# Patient Record
Sex: Female | Born: 1995 | Race: White | Hispanic: No | Smoking: Never smoker
Health system: Southern US, Community
[De-identification: ages and names within clinical notes are randomized; demographics above are authoritative.]

## PROBLEM LIST (undated history)

## (undated) DIAGNOSIS — F3130 Bipolar disorder, current episode depressed, mild or moderate severity, unspecified: Secondary | ICD-10-CM

## (undated) HISTORY — PX: NO PAST SURGERIES: SHX2092

---
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
## 2014-09-06 ENCOUNTER — Other Ambulatory Visit: Payer: Self-pay | Admitting: Obstetrics & Gynecology

## 2014-09-06 DIAGNOSIS — O3680X1 Pregnancy with inconclusive fetal viability, fetus 1: Secondary | ICD-10-CM

## 2014-09-07 ENCOUNTER — Ambulatory Visit (INDEPENDENT_AMBULATORY_CARE_PROVIDER_SITE_OTHER): Payer: Managed Care, Other (non HMO)

## 2014-09-07 DIAGNOSIS — O3680X1 Pregnancy with inconclusive fetal viability, fetus 1: Secondary | ICD-10-CM | POA: Diagnosis not present

## 2014-09-07 MED ORDER — DOXYLAMINE-PYRIDOXINE 10-10 MG PO TBEC: 10.0000 mg | DELAYED_RELEASE_TABLET | ORAL | Status: DC

## 2014-09-07 NOTE — Progress Notes (Signed)
US 8+5wks single IUP w/ys,pos fht 170,normal ov's bilat,crl 21.718mm

## 2014-09-10 ENCOUNTER — Other Ambulatory Visit: Payer: Self-pay

## 2014-09-18 ENCOUNTER — Encounter: Payer: Self-pay | Admitting: Women's Health

## 2014-09-18 ENCOUNTER — Ambulatory Visit (INDEPENDENT_AMBULATORY_CARE_PROVIDER_SITE_OTHER): Payer: Managed Care, Other (non HMO) | Admitting: Women's Health

## 2014-09-18 VITALS — BP 104/60 | HR 68 | Ht 60.0 in | Wt 143.0 lb

## 2014-09-18 DIAGNOSIS — Z331 Pregnant state, incidental: Secondary | ICD-10-CM

## 2014-09-18 DIAGNOSIS — Z1389 Encounter for screening for other disorder: Secondary | ICD-10-CM

## 2014-09-18 DIAGNOSIS — Z0283 Encounter for blood-alcohol and blood-drug test: Secondary | ICD-10-CM

## 2014-09-18 DIAGNOSIS — Z3401 Encounter for supervision of normal first pregnancy, first trimester: Secondary | ICD-10-CM | POA: Diagnosis not present

## 2014-09-18 DIAGNOSIS — Z3400 Encounter for supervision of normal first pregnancy, unspecified trimester: Secondary | ICD-10-CM | POA: Insufficient documentation

## 2014-09-18 DIAGNOSIS — Z369 Encounter for antenatal screening, unspecified: Secondary | ICD-10-CM

## 2014-09-18 DIAGNOSIS — Z3682 Encounter for antenatal screening for nuchal translucency: Secondary | ICD-10-CM

## 2014-09-18 LAB — POCT URINALYSIS DIPSTICK
Glucose, UA: NEGATIVE
LEUKOCYTES UA: NEGATIVE
Nitrite, UA: NEGATIVE
Protein, UA: NEGATIVE
RBC UA: NEGATIVE

## 2014-09-18 NOTE — Progress Notes (Signed)
  Subjective:  Tammy Price is a 19 y.o. G1P0 Caucasian female at [redacted]w[redacted]d by LMP c/w 8wk u/s, being seen today for her first obstetrical visit.  Her obstetrical history is significant for primigravida.  Pregnancy history fully reviewed.  Patient reports some nausea- on diclegis, only taking 2 qhs right now- can increase to 4 total daily (1 am, 1 afternoon, 2 qhs). Denies vb, cramping, uti s/s, abnormal/malodorous vag d/c, or vulvovaginal itching/irritation.  BP 104/60 mmHg  Pulse 68  Wt 143 lb (64.864 kg)  LMP 07/08/2014 (Exact Date)  HISTORY: OB History  Gravida Para Term Preterm AB SAB TAB Ectopic Multiple Living  1             # Outcome Date GA Lbr Len/2nd Weight Sex Delivery Anes PTL Lv  1 Current              Past Medical History  Diagnosis Date  . Bipolar affect, depressed    Past Surgical History  Procedure Laterality Date  . No past surgeries     Family History  Problem Relation Age of Onset  . Depression Mother   . Cancer Paternal Grandfather     Exam   System:     General: Well developed & nourished, no acute distress   Skin: Warm & dry, normal coloration and turgor, no rashes   Neurologic: Alert & oriented, normal mood   Cardiovascular: Regular rate & rhythm   Respiratory: Effort & rate normal, LCTAB, acyanotic   Abdomen: Soft, non tender   Extremities: normal strength, tone  Thin prep pap smear n/a <21yo  FHR: 157 via doppler   Assessment:   Pregnancy: G1P0 Patient Active Problem List   Diagnosis Date Noted  . Supervision of normal first pregnancy 09/18/2014    Priority: High    [redacted]w[redacted]d G1P0 New OB visit N/V of pregnancy  Plan:  Initial labs drawn Continue prenatal vitamins Problem list reviewed and updated Reviewed n/v relief measures and warning s/s to report Reviewed recommended weight gain based on pre-gravid BMI Encouraged well-balanced diet Genetic Screening discussed Integrated Screen: requested Cystic fibrosis screening discussed  requested Ultrasound discussed; fetal survey: requested Follow up in 2 weeks for 1st nt/it and visit [facility] completed  Marge Duncans CNM, [facility] 09/18/2014 2:10 PM

## 2014-09-18 NOTE — Patient Instructions (Signed)

## 2014-09-19 ENCOUNTER — Encounter: Payer: Self-pay | Admitting: Women's Health

## 2014-09-19 DIAGNOSIS — O26899 Other specified pregnancy related conditions, unspecified trimester: Secondary | ICD-10-CM

## 2014-09-19 DIAGNOSIS — Z6791 Unspecified blood type, Rh negative: Secondary | ICD-10-CM | POA: Insufficient documentation

## 2014-09-19 DIAGNOSIS — Z2839 Other underimmunization status: Secondary | ICD-10-CM | POA: Insufficient documentation

## 2014-09-19 DIAGNOSIS — Z283 Underimmunization status: Secondary | ICD-10-CM

## 2014-09-19 DIAGNOSIS — O09899 Supervision of other high risk pregnancies, unspecified trimester: Secondary | ICD-10-CM | POA: Insufficient documentation

## 2014-09-19 LAB — URINALYSIS, ROUTINE W REFLEX MICROSCOPIC
Bilirubin, UA: NEGATIVE
Glucose, UA: NEGATIVE
Leukocytes, UA: NEGATIVE
Nitrite, UA: NEGATIVE
PH UA: 7.5 (ref 5.0–7.5)
Protein, UA: NEGATIVE
RBC UA: NEGATIVE
SPEC GRAV UA: 1.019 (ref 1.005–1.030)
Urobilinogen, Ur: 0.2 mg/dL (ref 0.2–1.0)

## 2014-09-19 LAB — RPR: RPR Ser Ql: NONREACTIVE

## 2014-09-19 LAB — GC/CHLAMYDIA PROBE AMP
Chlamydia trachomatis, NAA: NEGATIVE
NEISSERIA GONORRHOEAE BY PCR: NEGATIVE

## 2014-09-19 LAB — CBC
HEMATOCRIT: 39 % (ref 34.0–46.6)
HEMOGLOBIN: 13.4 g/dL (ref 11.1–15.9)
MCH: 31.8 pg (ref 26.6–33.0)
MCHC: 34.4 g/dL (ref 31.5–35.7)
MCV: 93 fL (ref 79–97)
Platelets: 264 10*3/uL (ref 150–379)
RBC: 4.21 x10E6/uL (ref 3.77–5.28)
RDW: 13.8 % (ref 12.3–15.4)
WBC: 11.4 10*3/uL — AB (ref 3.4–10.8)

## 2014-09-19 LAB — PMP SCREEN PROFILE (10S), URINE
AMPHETAMINE SCRN UR: NEGATIVE ng/mL
BENZODIAZEPINE SCREEN, URINE: NEGATIVE ng/mL
Barbiturate Screen, Ur: NEGATIVE ng/mL
CANNABINOIDS UR QL SCN: NEGATIVE ng/mL
COCAINE(METAB.) SCREEN, URINE: NEGATIVE ng/mL
Creatinine(Crt), U: 89.3 mg/dL (ref 20.0–300.0)
Methadone Scn, Ur: NEGATIVE ng/mL
OPIATE SCRN UR: NEGATIVE ng/mL
OXYCODONE+OXYMORPHONE UR QL SCN: NEGATIVE ng/mL
PCP SCRN UR: NEGATIVE ng/mL
PH UR, DRUG SCRN: 7.4 (ref 4.5–8.9)
PROPOXYPHENE SCREEN: NEGATIVE ng/mL

## 2014-09-19 LAB — ABO/RH: RH TYPE: NEGATIVE

## 2014-09-19 LAB — URINE CULTURE

## 2014-09-19 LAB — HEPATITIS B SURFACE ANTIGEN: HEP B S AG: NEGATIVE

## 2014-09-19 LAB — HIV ANTIBODY (ROUTINE TESTING W REFLEX): HIV Screen 4th Generation wRfx: NONREACTIVE

## 2014-09-19 LAB — VARICELLA ZOSTER ANTIBODY, IGG

## 2014-09-19 LAB — RUBELLA SCREEN: Rubella Antibodies, IGG: 1.14 index (ref >0.99)

## 2014-09-19 LAB — ANTIBODY SCREEN: Antibody Screen: NEGATIVE

## 2014-09-26 LAB — CYSTIC FIBROSIS MUTATION 97: GENE DIS ANAL CARRIER INTERP BLD/T-IMP: NOT DETECTED

## 2014-10-02 ENCOUNTER — Encounter: Payer: Self-pay | Admitting: Women's Health

## 2014-10-02 ENCOUNTER — Ambulatory Visit (INDEPENDENT_AMBULATORY_CARE_PROVIDER_SITE_OTHER): Payer: Managed Care, Other (non HMO)

## 2014-10-02 ENCOUNTER — Ambulatory Visit (INDEPENDENT_AMBULATORY_CARE_PROVIDER_SITE_OTHER): Payer: Managed Care, Other (non HMO) | Admitting: Women's Health

## 2014-10-02 VITALS — BP 104/62 | HR 72 | Wt 146.0 lb

## 2014-10-02 DIAGNOSIS — Z3682 Encounter for antenatal screening for nuchal translucency: Secondary | ICD-10-CM

## 2014-10-02 DIAGNOSIS — Z36 Encounter for antenatal screening of mother: Secondary | ICD-10-CM | POA: Diagnosis not present

## 2014-10-02 DIAGNOSIS — Z3401 Encounter for supervision of normal first pregnancy, first trimester: Secondary | ICD-10-CM

## 2014-10-02 DIAGNOSIS — Z331 Pregnant state, incidental: Secondary | ICD-10-CM

## 2014-10-02 DIAGNOSIS — Z1389 Encounter for screening for other disorder: Secondary | ICD-10-CM

## 2014-10-02 LAB — POCT URINALYSIS DIPSTICK
Blood, UA: NEGATIVE
Glucose, UA: NEGATIVE
KETONES UA: NEGATIVE
Leukocytes, UA: NEGATIVE
NITRITE UA: NEGATIVE
PROTEIN UA: NEGATIVE

## 2014-10-02 NOTE — Progress Notes (Signed)
[place] 12+2wks measurement c/w dates,crl 62.48mm,nt 1.33mm,nb present,normal ov's bilat,post pl gr 0

## 2014-10-03 NOTE — Progress Notes (Signed)
Pt left w/o being seen after nt/it, rescheduled. Not seen by provider.

## 2014-10-04 LAB — MATERNAL SCREEN, INTEGRATED #1
CROWN RUMP LENGTH MAT SCREEN: 62.9 mm
GEST. AGE ON COLLECTION DATE: 12.6 wk
MATERNAL AGE AT EDD: 21.1 a
NUMBER OF FETUSES: 1
Nuchal Translucency (NT): 1.6 mm
PAPP-A Value: 1146.9 ng/mL
WEIGHT: 146 [lb_av]

## 2014-10-17 ENCOUNTER — Encounter: Payer: Managed Care, Other (non HMO) | Admitting: Advanced Practice Midwife

## 2014-11-01 ENCOUNTER — Encounter: Payer: Managed Care, Other (non HMO) | Admitting: Advanced Practice Midwife

## 2014-11-05 ENCOUNTER — Ambulatory Visit (INDEPENDENT_AMBULATORY_CARE_PROVIDER_SITE_OTHER): Payer: Managed Care, Other (non HMO) | Admitting: Women's Health

## 2014-11-05 ENCOUNTER — Encounter: Payer: Self-pay | Admitting: Women's Health

## 2014-11-05 ENCOUNTER — Telehealth: Payer: Self-pay | Admitting: Women's Health

## 2014-11-05 VITALS — BP 118/70 | HR 76 | Wt 148.4 lb

## 2014-11-05 DIAGNOSIS — Z1389 Encounter for screening for other disorder: Secondary | ICD-10-CM

## 2014-11-05 DIAGNOSIS — Z3402 Encounter for supervision of normal first pregnancy, second trimester: Secondary | ICD-10-CM

## 2014-11-05 DIAGNOSIS — Z363 Encounter for antenatal screening for malformations: Secondary | ICD-10-CM

## 2014-11-05 DIAGNOSIS — Z331 Pregnant state, incidental: Secondary | ICD-10-CM

## 2014-11-05 DIAGNOSIS — Z369 Encounter for antenatal screening, unspecified: Secondary | ICD-10-CM

## 2014-11-05 LAB — POCT URINALYSIS DIPSTICK
Glucose, UA: NEGATIVE
KETONES UA: NEGATIVE
LEUKOCYTES UA: NEGATIVE
Nitrite, UA: NEGATIVE
PROTEIN UA: NEGATIVE
RBC UA: NEGATIVE

## 2014-11-05 MED ORDER — PRENATAL PLUS 27-1 MG PO TABS: 1.0000 | ORAL_TABLET | Freq: Every day | ORAL | Status: DC

## 2014-11-05 NOTE — Patient Instructions (Signed)
Second Trimester of Pregnancy The second trimester is from week 13 through week 28, months 4 through 6. The second trimester is often a time when you feel your best. Your body has also adjusted to being pregnant, and you begin to feel better physically. Usually, morning sickness has lessened or quit completely, you may have more energy, and you may have an increase in appetite. The second trimester is also a time when the fetus is growing rapidly. At the end of the sixth month, the fetus is about 9 inches long and weighs about 1 pounds. You will likely begin to feel the baby move (quickening) between 18 and 20 weeks of the pregnancy. BODY CHANGES Your body goes through many changes during pregnancy. The changes vary from woman to woman.   Your weight will continue to increase. You will notice your lower abdomen bulging out.  You may begin to get stretch marks on your hips, abdomen, and breasts.  You may develop headaches that can be relieved by medicines approved by your health care provider.  You may urinate more often because the fetus is pressing on your bladder.  You may develop or continue to have heartburn as a result of your pregnancy.  You may develop constipation because certain hormones are causing the muscles that push waste through your intestines to slow down.  You may develop hemorrhoids or swollen, bulging veins (varicose veins).  You may have back pain because of the weight gain and pregnancy hormones relaxing your joints between the bones in your pelvis and as a result of a shift in weight and the muscles that support your balance.  Your breasts will continue to grow and be tender.  Your gums may bleed and may be sensitive to brushing and flossing.  Dark spots or blotches (chloasma, mask of pregnancy) may develop on your face. This will likely fade after the baby is born.  A dark line from your belly button to the pubic area (linea nigra) may appear. This will likely fade  after the baby is born.  You may have changes in your hair. These can include thickening of your hair, rapid growth, and changes in texture. Some women also have hair loss during or after pregnancy, or hair that feels dry or thin. Your hair will most likely return to normal after your baby is born. WHAT TO EXPECT AT YOUR PRENATAL VISITS During a routine prenatal visit:  You will be weighed to make sure you and the fetus are growing normally.  Your blood pressure will be taken.  Your abdomen will be measured to track your baby's growth.  The fetal heartbeat will be listened to.  Any test results from the previous visit will be discussed. Your health care provider may ask you:  How you are feeling.  If you are feeling the baby move.  If you have had any abnormal symptoms, such as leaking fluid, bleeding, severe headaches, or abdominal cramping.  If you have any questions. Other tests that may be performed during your second trimester include:  Blood tests that check for:  Low iron levels (anemia).  Gestational diabetes (between 24 and 28 weeks).  Rh antibodies.  Urine tests to check for infections, diabetes, or protein in the urine.  An ultrasound to confirm the proper growth and development of the baby.  An amniocentesis to check for possible genetic problems.  Fetal screens for spina bifida and Down syndrome. HOME CARE INSTRUCTIONS   Avoid all smoking, herbs, alcohol, and unprescribed   drugs. These chemicals affect the formation and growth of the baby.  Follow your health care provider's instructions regarding medicine use. There are medicines that are either safe or unsafe to take during pregnancy.  Exercise only as directed by your health care provider. Experiencing uterine cramps is a good sign to stop exercising.  Continue to eat regular, healthy meals.  Wear a good support bra for breast tenderness.  Do not use hot tubs, steam rooms, or saunas.  Wear your  seat belt at all times when driving.  Avoid raw meat, uncooked cheese, cat litter boxes, and soil used by cats. These carry germs that can cause birth defects in the baby.  Take your prenatal vitamins.  Try taking a stool softener (if your health care provider approves) if you develop constipation. Eat more high-fiber foods, such as fresh vegetables or fruit and whole grains. Drink plenty of fluids to keep your urine clear or pale yellow.  Take warm sitz baths to soothe any pain or discomfort caused by hemorrhoids. Use hemorrhoid cream if your health care provider approves.  If you develop varicose veins, wear support hose. Elevate your feet for 15 minutes, 3-4 times a day. Limit salt in your diet.  Avoid heavy lifting, wear low heel shoes, and practice good posture.  Rest with your legs elevated if you have leg cramps or low back pain.  Visit your dentist if you have not gone yet during your pregnancy. Use a soft toothbrush to brush your teeth and be gentle when you floss.  A sexual relationship may be continued unless your health care provider directs you otherwise.  Continue to go to all your prenatal visits as directed by your health care provider. SEEK MEDICAL CARE IF:   You have dizziness.  You have mild pelvic cramps, pelvic pressure, or nagging pain in the abdominal area.  You have persistent nausea, vomiting, or diarrhea.  You have a bad smelling vaginal discharge.  You have pain with urination. SEEK IMMEDIATE MEDICAL CARE IF:   You have a fever.  You are leaking fluid from your vagina.  You have spotting or bleeding from your vagina.  You have severe abdominal cramping or pain.  You have rapid weight gain or loss.  You have shortness of breath with chest pain.  You notice sudden or extreme swelling of your face, hands, ankles, feet, or legs.  You have not felt your baby move in over an hour.  You have severe headaches that do not go away with  medicine.  You have vision changes. Document Released: 02/03/2001 Document Revised: 02/14/2013 Document Reviewed: 04/12/2012 Exit[employer] Patient Information 2015 [employer]. This information is not intended to replace advice given to you by your health care provider. Make sure you discuss any questions you have with your health care provider.  

## 2014-11-05 NOTE — Progress Notes (Signed)
Low-risk OB appointment G1P0 [redacted]w[redacted]d Estimated Date of Delivery: 04/14/15 BP 118/70 mmHg  Pulse 76  Wt 148 lb 6.4 oz (67.314 kg)  LMP 07/08/2014 (Exact Date)  BP, weight, and urine reviewed.  Refer to obstetrical flow sheet for FH & FHR.  No fm yet. Denies cramping, lof, vb, or uti s/s. No complaints. Reviewed warning s/s to report. Going to [employer] this week.  Plan:  Continue routine obstetrical care  F/U in 3wks for OB appointment and anatomy u/s 2nd IT today

## 2014-11-05 NOTE — Telephone Encounter (Signed)
Pt aware that PNV had been sent to her pharmacy.

## 2014-11-07 LAB — MATERNAL SCREEN, INTEGRATED #2
AFP MoM: 1.2
Alpha-Fetoprotein: 44.7 ng/mL
Crown Rump Length: 62.9 mm
DIA MoM: 1
DIA VALUE: 174.7 pg/mL
ESTRIOL UNCONJUGATED: 1.67 ng/mL
Gest. Age on Collection Date: 12.6 weeks
Gestational Age: 17.4 weeks
HCG MOM: 0.9
HCG VALUE: 24.3 [IU]/mL
MATERNAL AGE AT EDD: 21.1 a
NUCHAL TRANSLUCENCY (NT): 1.6 mm
Nuchal Translucency MoM: 1.05
Number of Fetuses: 1
PAPP-A MOM: 1.11
PAPP-A Value: 1146.9 ng/mL
Test Results:: NEGATIVE
WEIGHT: 146 [lb_av]
Weight: 146 [lb_av]
uE3 MoM: 1.44

## 2014-11-26 ENCOUNTER — Ambulatory Visit (INDEPENDENT_AMBULATORY_CARE_PROVIDER_SITE_OTHER): Payer: Managed Care, Other (non HMO)

## 2014-11-26 ENCOUNTER — Ambulatory Visit (INDEPENDENT_AMBULATORY_CARE_PROVIDER_SITE_OTHER): Payer: Managed Care, Other (non HMO) | Admitting: Women's Health

## 2014-11-26 ENCOUNTER — Encounter: Payer: Self-pay | Admitting: Women's Health

## 2014-11-26 VITALS — BP 90/58 | HR 72 | Wt 153.0 lb

## 2014-11-26 DIAGNOSIS — Z1389 Encounter for screening for other disorder: Secondary | ICD-10-CM

## 2014-11-26 DIAGNOSIS — Z3402 Encounter for supervision of normal first pregnancy, second trimester: Secondary | ICD-10-CM

## 2014-11-26 DIAGNOSIS — Z331 Pregnant state, incidental: Secondary | ICD-10-CM

## 2014-11-26 DIAGNOSIS — Z23 Encounter for immunization: Secondary | ICD-10-CM | POA: Diagnosis not present

## 2014-11-26 DIAGNOSIS — Z363 Encounter for antenatal screening for malformations: Secondary | ICD-10-CM

## 2014-11-26 DIAGNOSIS — Z36 Encounter for antenatal screening of mother: Secondary | ICD-10-CM | POA: Diagnosis not present

## 2014-11-26 LAB — POCT URINALYSIS DIPSTICK
Glucose, UA: NEGATIVE
KETONES UA: NEGATIVE
LEUKOCYTES UA: NEGATIVE
Nitrite, UA: NEGATIVE
PROTEIN UA: NEGATIVE
RBC UA: NEGATIVE

## 2014-11-26 NOTE — Patient Instructions (Signed)
Second Trimester of Pregnancy The second trimester is from week 13 through week 28, months 4 through 6. The second trimester is often a time when you feel your best. Your body has also adjusted to being pregnant, and you begin to feel better physically. Usually, morning sickness has lessened or quit completely, you may have more energy, and you may have an increase in appetite. The second trimester is also a time when the fetus is growing rapidly. At the end of the sixth month, the fetus is about 9 inches long and weighs about 1 pounds. You will likely begin to feel the baby move (quickening) between 18 and 20 weeks of the pregnancy. BODY CHANGES Your body goes through many changes during pregnancy. The changes vary from woman to woman.   Your weight will continue to increase. You will notice your lower abdomen bulging out.  You may begin to get stretch marks on your hips, abdomen, and breasts.  You may develop headaches that can be relieved by medicines approved by your health care provider.  You may urinate more often because the fetus is pressing on your bladder.  You may develop or continue to have heartburn as a result of your pregnancy.  You may develop constipation because certain hormones are causing the muscles that push waste through your intestines to slow down.  You may develop hemorrhoids or swollen, bulging veins (varicose veins).  You may have back pain because of the weight gain and pregnancy hormones relaxing your joints between the bones in your pelvis and as a result of a shift in weight and the muscles that support your balance.  Your breasts will continue to grow and be tender.  Your gums may bleed and may be sensitive to brushing and flossing.  Dark spots or blotches (chloasma, mask of pregnancy) may develop on your face. This will likely fade after the baby is born.  A dark line from your belly button to the pubic area (linea nigra) may appear. This will likely fade  after the baby is born.  You may have changes in your hair. These can include thickening of your hair, rapid growth, and changes in texture. Some women also have hair loss during or after pregnancy, or hair that feels dry or thin. Your hair will most likely return to normal after your baby is born. WHAT TO EXPECT AT YOUR PRENATAL VISITS During a routine prenatal visit:  You will be weighed to make sure you and the fetus are growing normally.  Your blood pressure will be taken.  Your abdomen will be measured to track your baby's growth.  The fetal heartbeat will be listened to.  Any test results from the previous visit will be discussed. Your health care provider may ask you:  How you are feeling.  If you are feeling the baby move.  If you have had any abnormal symptoms, such as leaking fluid, bleeding, severe headaches, or abdominal cramping.  If you have any questions. Other tests that may be performed during your second trimester include:  Blood tests that check for:  Low iron levels (anemia).  Gestational diabetes (between 24 and 28 weeks).  Rh antibodies.  Urine tests to check for infections, diabetes, or protein in the urine.  An ultrasound to confirm the proper growth and development of the baby.  An amniocentesis to check for possible genetic problems.  Fetal screens for spina bifida and Down syndrome. HOME CARE INSTRUCTIONS   Avoid all smoking, herbs, alcohol, and unprescribed   drugs. These chemicals affect the formation and growth of the baby.  Follow your health care provider's instructions regarding medicine use. There are medicines that are either safe or unsafe to take during pregnancy.  Exercise only as directed by your health care provider. Experiencing uterine cramps is a good sign to stop exercising.  Continue to eat regular, healthy meals.  Wear a good support bra for breast tenderness.  Do not use hot tubs, steam rooms, or saunas.  Wear your  seat belt at all times when driving.  Avoid raw meat, uncooked cheese, cat litter boxes, and soil used by cats. These carry germs that can cause birth defects in the baby.  Take your prenatal vitamins.  Try taking a stool softener (if your health care provider approves) if you develop constipation. Eat more high-fiber foods, such as fresh vegetables or fruit and whole grains. Drink plenty of fluids to keep your urine clear or pale yellow.  Take warm sitz baths to soothe any pain or discomfort caused by hemorrhoids. Use hemorrhoid cream if your health care provider approves.  If you develop varicose veins, wear support hose. Elevate your feet for 15 minutes, 3-4 times a day. Limit salt in your diet.  Avoid heavy lifting, wear low heel shoes, and practice good posture.  Rest with your legs elevated if you have leg cramps or low back pain.  Visit your dentist if you have not gone yet during your pregnancy. Use a soft toothbrush to brush your teeth and be gentle when you floss.  A sexual relationship may be continued unless your health care provider directs you otherwise.  Continue to go to all your prenatal visits as directed by your health care provider. SEEK MEDICAL CARE IF:   You have dizziness.  You have mild pelvic cramps, pelvic pressure, or nagging pain in the abdominal area.  You have persistent nausea, vomiting, or diarrhea.  You have a bad smelling vaginal discharge.  You have pain with urination. SEEK IMMEDIATE MEDICAL CARE IF:   You have a fever.  You are leaking fluid from your vagina.  You have spotting or bleeding from your vagina.  You have severe abdominal cramping or pain.  You have rapid weight gain or loss.  You have shortness of breath with chest pain.  You notice sudden or extreme swelling of your face, hands, ankles, feet, or legs.  You have not felt your baby move in over an hour.  You have severe headaches that do not go away with  medicine.  You have vision changes. Document Released: 02/03/2001 Document Revised: 02/14/2013 Document Reviewed: 04/12/2012 Exit[employer] Patient Information 2015 [employer]. This information is not intended to replace advice given to you by your health care provider. Make sure you discuss any questions you have with your health care provider.  

## 2014-11-26 NOTE — Progress Notes (Signed)
[place] 20+1wks,measurements c/w dates,EFW 356g, normal ov's bilat,cephalic,cx 4.1cm,post pl gr 0,fhr 142 bpm,svp of fluid 4.6cm,anatomy complete no obvious abn seen.

## 2014-11-26 NOTE — Progress Notes (Signed)
Low-risk OB appointment G1P0 [redacted]w[redacted]d Estimated Date of Delivery: 04/14/15 BP 90/58 mmHg  Pulse 72  Wt 153 lb (69.4 kg)  LMP 07/08/2014 (Exact Date)  BP, weight, and urine reviewed.  Refer to obstetrical flow sheet for FH & FHR.  Reports good fm.  Denies regular uc's, lof, vb, or uti s/s. No complaints. Reviewed today's normal anatomy u/s, ptl s/s, fm. Plan:  Continue routine obstetrical care  F/U in 4wks for OB appointment  Flu shot today

## 2014-12-24 ENCOUNTER — Encounter: Payer: Managed Care, Other (non HMO) | Admitting: Women's Health

## 2015-01-01 ENCOUNTER — Ambulatory Visit (INDEPENDENT_AMBULATORY_CARE_PROVIDER_SITE_OTHER): Payer: Managed Care, Other (non HMO) | Admitting: Women's Health

## 2015-01-01 ENCOUNTER — Encounter: Payer: Self-pay | Admitting: Women's Health

## 2015-01-01 VITALS — BP 124/60 | HR 72 | Wt 161.0 lb

## 2015-01-01 DIAGNOSIS — Z3402 Encounter for supervision of normal first pregnancy, second trimester: Secondary | ICD-10-CM

## 2015-01-01 DIAGNOSIS — Z331 Pregnant state, incidental: Secondary | ICD-10-CM

## 2015-01-01 DIAGNOSIS — Z1389 Encounter for screening for other disorder: Secondary | ICD-10-CM

## 2015-01-01 LAB — POCT URINALYSIS DIPSTICK
Blood, UA: NEGATIVE
Glucose, UA: NEGATIVE
Ketones, UA: NEGATIVE
Leukocytes, UA: NEGATIVE
NITRITE UA: NEGATIVE
PROTEIN UA: NEGATIVE

## 2015-01-01 NOTE — Progress Notes (Signed)
Low-risk OB appointment G1P0 1858w2d Estimated Date of Delivery: 04/14/15 BP 124/60 mmHg  Pulse 72  Wt 161 lb (73.029 kg)  LMP 07/08/2014 (Exact Date)  BP, weight, and urine reviewed.  Refer to obstetrical flow sheet for FH & FHR.  Reports good fm.  Denies regular uc's, lof, vb, or uti s/s. No complaints. Reviewed ptl s/s, fm. Plan:  Continue routine obstetrical care  F/U in 3wks for OB appointment and pn2

## 2015-01-01 NOTE — Patient Instructions (Signed)
You will have your sugar test next visit.  Please do not eat or drink anything after midnight the night before you come, not even water.  You will be here for at least two hours.     Call the office ([phone]) or go to [facility] if:  You begin to have strong, frequent contractions  Your water breaks.  Sometimes it is a big gush of fluid, sometimes it is just a trickle that keeps getting your panties wet or running down your legs  You have vaginal bleeding.  It is normal to have a small amount of spotting if your cervix was checked.   You don't feel your baby moving like normal.  If you don't, get you something to eat and drink and lay down and focus on feeling your baby move.   If your baby is still not moving like normal, you should call the office or go to [facility].  Second Trimester of Pregnancy The second trimester is from week 13 through week 28, months 4 through 6. The second trimester is often a time when you feel your best. Your body has also adjusted to being pregnant, and you begin to feel better physically. Usually, morning sickness has lessened or quit completely, you may have more energy, and you may have an increase in appetite. The second trimester is also a time when the fetus is growing rapidly. At the end of the sixth month, the fetus is about 9 inches long and weighs about 1 pounds. You will likely begin to feel the baby move (quickening) between 18 and 20 weeks of the pregnancy. BODY CHANGES Your body goes through many changes during pregnancy. The changes vary from woman to woman.   Your weight will continue to increase. You will notice your lower abdomen bulging out.  You may begin to get stretch marks on your hips, abdomen, and breasts.  You may develop headaches that can be relieved by medicines approved by your health care provider.  You may urinate more often because the fetus is pressing on your bladder.  You may develop or continue to have  heartburn as a result of your pregnancy.  You may develop constipation because certain hormones are causing the muscles that push waste through your intestines to slow down.  You may develop hemorrhoids or swollen, bulging veins (varicose veins).  You may have back pain because of the weight gain and pregnancy hormones relaxing your joints between the bones in your pelvis and as a result of a shift in weight and the muscles that support your balance.  Your breasts will continue to grow and be tender.  Your gums may bleed and may be sensitive to brushing and flossing.  Dark spots or blotches (chloasma, mask of pregnancy) may develop on your face. This will likely fade after the baby is born.  A dark line from your belly button to the pubic area (linea nigra) may appear. This will likely fade after the baby is born.  You may have changes in your hair. These can include thickening of your hair, rapid growth, and changes in texture. Some women also have hair loss during or after pregnancy, or hair that feels dry or thin. Your hair will most likely return to normal after your baby is born. WHAT TO EXPECT AT YOUR PRENATAL VISITS During a routine prenatal visit:  You will be weighed to make sure you and the fetus are growing normally.  Your blood pressure will be taken.    Your abdomen will be measured to track your baby's growth.  The fetal heartbeat will be listened to.  Any test results from the previous visit will be discussed. Your health care provider may ask you:  How you are feeling.  If you are feeling the baby move.  If you have had any abnormal symptoms, such as leaking fluid, bleeding, severe headaches, or abdominal cramping.  If you have any questions. Other tests that may be performed during your second trimester include:  Blood tests that check for:  Low iron levels (anemia).  Gestational diabetes (between 24 and 28 weeks).  Rh antibodies.  Urine tests to check  for infections, diabetes, or protein in the urine.  An ultrasound to confirm the proper growth and development of the baby.  An amniocentesis to check for possible genetic problems.  Fetal screens for spina bifida and Down syndrome. HOME CARE INSTRUCTIONS   Avoid all smoking, herbs, alcohol, and unprescribed drugs. These chemicals affect the formation and growth of the baby.  Follow your health care provider's instructions regarding medicine use. There are medicines that are either safe or unsafe to take during pregnancy.  Exercise only as directed by your health care provider. Experiencing uterine cramps is a good sign to stop exercising.  Continue to eat regular, healthy meals.  Wear a good support bra for breast tenderness.  Do not use hot tubs, steam rooms, or saunas.  Wear your seat belt at all times when driving.  Avoid raw meat, uncooked cheese, cat litter boxes, and soil used by cats. These carry germs that can cause birth defects in the baby.  Take your prenatal vitamins.  Try taking a stool softener (if your health care provider approves) if you develop constipation. Eat more high-fiber foods, such as fresh vegetables or fruit and whole grains. Drink plenty of fluids to keep your urine clear or pale yellow.  Take warm sitz baths to soothe any pain or discomfort caused by hemorrhoids. Use hemorrhoid cream if your health care provider approves.  If you develop varicose veins, wear support hose. Elevate your feet for 15 minutes, 3-4 times a day. Limit salt in your diet.  Avoid heavy lifting, wear low heel shoes, and practice good posture.  Rest with your legs elevated if you have leg cramps or low back pain.  Visit your dentist if you have not gone yet during your pregnancy. Use a soft toothbrush to brush your teeth and be gentle when you floss.  A sexual relationship may be continued unless your health care provider directs you otherwise.  Continue to go to all your  prenatal visits as directed by your health care provider. SEEK MEDICAL CARE IF:   You have dizziness.  You have mild pelvic cramps, pelvic pressure, or nagging pain in the abdominal area.  You have persistent nausea, vomiting, or diarrhea.  You have a bad smelling vaginal discharge.  You have pain with urination. SEEK IMMEDIATE MEDICAL CARE IF:   You have a fever.  You are leaking fluid from your vagina.  You have spotting or bleeding from your vagina.  You have severe abdominal cramping or pain.  You have rapid weight gain or loss.  You have shortness of breath with chest pain.  You notice sudden or extreme swelling of your face, hands, ankles, feet, or legs.  You have not felt your baby move in over an hour.  You have severe headaches that do not go away with medicine.  You have vision changes.   Document Released: 02/03/2001 Document Revised: 02/14/2013 Document Reviewed: 04/12/2012 ExitCare Patient Information 2015 [employer]. This information is not intended to replace advice given to you by your health care provider. Make sure you discuss any questions you have with your health care provider.     

## 2015-01-22 ENCOUNTER — Encounter: Payer: Self-pay | Admitting: Women's Health

## 2015-01-22 ENCOUNTER — Ambulatory Visit (INDEPENDENT_AMBULATORY_CARE_PROVIDER_SITE_OTHER): Payer: Managed Care, Other (non HMO) | Admitting: Women's Health

## 2015-01-22 ENCOUNTER — Other Ambulatory Visit: Payer: Managed Care, Other (non HMO)

## 2015-01-22 VITALS — BP 118/68 | HR 72 | Wt 165.5 lb

## 2015-01-22 DIAGNOSIS — Z3403 Encounter for supervision of normal first pregnancy, third trimester: Secondary | ICD-10-CM

## 2015-01-22 DIAGNOSIS — Z131 Encounter for screening for diabetes mellitus: Secondary | ICD-10-CM

## 2015-01-22 DIAGNOSIS — Z331 Pregnant state, incidental: Secondary | ICD-10-CM

## 2015-01-22 DIAGNOSIS — Z1389 Encounter for screening for other disorder: Secondary | ICD-10-CM

## 2015-01-22 DIAGNOSIS — Z369 Encounter for antenatal screening, unspecified: Secondary | ICD-10-CM

## 2015-01-22 LAB — POCT URINALYSIS DIPSTICK
Glucose, UA: NEGATIVE
Ketones, UA: NEGATIVE
LEUKOCYTES UA: NEGATIVE
Nitrite, UA: NEGATIVE
Protein, UA: NEGATIVE
RBC UA: NEGATIVE

## 2015-01-22 NOTE — Progress Notes (Signed)
Low-risk OB appointment G1P0 5821w2d Estimated Date of Delivery: 04/14/15 BP 118/68 mmHg  Pulse 72  Wt 165 lb 8 oz (75.07 kg)  LMP 07/08/2014 (Exact Date)  BP, weight, and urine reviewed.  Refer to obstetrical flow sheet for FH & FHR.  Reports good fm.  Denies regular uc's, lof, vb, or uti s/s. No complaints. Reviewed ptl s/s, fkc. Recommended Tdap at HD/PCP per CDC guidelines.  Plan:  Continue routine obstetrical care  F/U in 4wks for OB appointment  PN2 today

## 2015-01-22 NOTE — Patient Instructions (Signed)

## 2015-01-23 LAB — CBC
HEMATOCRIT: 35 % (ref 34.0–46.6)
Hemoglobin: 11.9 g/dL (ref 11.1–15.9)
MCH: 33.2 pg — ABNORMAL HIGH (ref 26.6–33.0)
MCHC: 34 g/dL (ref 31.5–35.7)
MCV: 98 fL — ABNORMAL HIGH (ref 79–97)
NRBC: 0 % (ref 0–0)
Platelets: 254 10*3/uL (ref 150–379)
RBC: 3.58 x10E6/uL — AB (ref 3.77–5.28)
RDW: 12.6 % (ref 12.3–15.4)
WBC: 12.7 10*3/uL — AB (ref 3.4–10.8)

## 2015-01-23 LAB — GLUCOSE TOLERANCE, 2 HOURS W/ 1HR
GLUCOSE, 2 HOUR: 91 mg/dL (ref 65–152)
GLUCOSE, FASTING: 76 mg/dL (ref 65–91)
Glucose, 1 hour: 120 mg/dL (ref 65–179)

## 2015-01-23 LAB — HIV ANTIBODY (ROUTINE TESTING W REFLEX): HIV SCREEN 4TH GENERATION: NONREACTIVE

## 2015-01-23 LAB — ANTIBODY SCREEN: Antibody Screen: NEGATIVE

## 2015-01-23 LAB — RPR: RPR Ser Ql: NONREACTIVE

## 2015-02-19 ENCOUNTER — Ambulatory Visit (INDEPENDENT_AMBULATORY_CARE_PROVIDER_SITE_OTHER): Payer: Managed Care, Other (non HMO) | Admitting: Advanced Practice Midwife

## 2015-02-19 ENCOUNTER — Encounter: Payer: Self-pay | Admitting: Advanced Practice Midwife

## 2015-02-19 VITALS — BP 110/80 | HR 92 | Wt 176.0 lb

## 2015-02-19 DIAGNOSIS — Z3403 Encounter for supervision of normal first pregnancy, third trimester: Secondary | ICD-10-CM

## 2015-02-19 DIAGNOSIS — O360130 Maternal care for anti-D [Rh] antibodies, third trimester, not applicable or unspecified: Secondary | ICD-10-CM

## 2015-02-19 DIAGNOSIS — Z331 Pregnant state, incidental: Secondary | ICD-10-CM

## 2015-02-19 DIAGNOSIS — Z1389 Encounter for screening for other disorder: Secondary | ICD-10-CM

## 2015-02-19 DIAGNOSIS — Z3A32 32 weeks gestation of pregnancy: Secondary | ICD-10-CM

## 2015-02-19 LAB — POCT URINALYSIS DIPSTICK
GLUCOSE UA: NEGATIVE
KETONES UA: NEGATIVE
Leukocytes, UA: NEGATIVE
Nitrite, UA: NEGATIVE
Protein, UA: NEGATIVE
RBC UA: NEGATIVE

## 2015-02-19 MED ORDER — RHO D IMMUNE GLOBULIN 1500 UNIT/2ML IJ SOSY
300.0000 ug | PREFILLED_SYRINGE | Freq: Once | INTRAMUSCULAR | Status: AC
  Administered 2015-02-19: 300 ug via INTRAMUSCULAR

## 2015-02-19 NOTE — Addendum Note (Signed)
Addended by: Federico FlakeNES, PEGGY A on: 02/19/2015 10:40 AM   Modules accepted: Orders

## 2015-02-19 NOTE — Progress Notes (Signed)
G1P0 8627w2d Estimated Date of Delivery: 04/14/15  Blood pressure 110/80, pulse 92, weight 176 lb (79.833 kg), last menstrual period 07/08/2014.   BP weight and urine results all reviewed and noted.  Please refer to the obstetrical flow sheet for the fundal height and fetal heart rate documentation:  Patient reports good fetal movement, denies any bleeding and no rupture of membranes symptoms or regular contractions. Patient is without complaints. All questions were answered.  Orders Placed This Encounter  Procedures  . POCT urinalysis dipstick    Plan:  Continued routine obstetrical care, rhogam today   Return in about 2 weeks (around 03/05/2015) for LROB.

## 2015-02-24 NOTE — L&D Delivery Note (Signed)
Delivery Note  Patient presented with spontaneous onset of labor after SROM at home. Total time of rupture approximately 13 hours. Augmented with pitocin. Intermittent shallow late decels during active labor.  At 4:35 PM a viable female was delivered via Vaginal, Spontaneous Delivery (Presentation: Right Occiput Anterior).  APGAR: 9/9; weight 3005 g.   Placenta status: intact save for trailing membrane .  Cord: 3 vessel. with the following complications: retained placenta membrane removed with manual extraction. Ancef 2 g IV given..  Cord pH: not obtained  Anesthesia: Epidural  Episiotomy: None Lacerations:  none Est. Blood Loss (mL):  150  Mom to postpartum.  Baby to Couplet care / Skin to Skin.  Tammy Price 03/31/2015, 4:59 PM

## 2015-03-05 ENCOUNTER — Encounter: Payer: Self-pay | Admitting: Women's Health

## 2015-03-05 ENCOUNTER — Ambulatory Visit (INDEPENDENT_AMBULATORY_CARE_PROVIDER_SITE_OTHER): Payer: Managed Care, Other (non HMO) | Admitting: Women's Health

## 2015-03-05 VITALS — BP 112/70 | HR 76 | Wt 177.0 lb

## 2015-03-05 DIAGNOSIS — Z1389 Encounter for screening for other disorder: Secondary | ICD-10-CM

## 2015-03-05 DIAGNOSIS — Z331 Pregnant state, incidental: Secondary | ICD-10-CM

## 2015-03-05 DIAGNOSIS — Z3403 Encounter for supervision of normal first pregnancy, third trimester: Secondary | ICD-10-CM

## 2015-03-05 LAB — POCT URINALYSIS DIPSTICK
Blood, UA: NEGATIVE
Glucose, UA: NEGATIVE
Ketones, UA: NEGATIVE
NITRITE UA: NEGATIVE
PROTEIN UA: NEGATIVE

## 2015-03-05 NOTE — Patient Instructions (Signed)
Call the office ([phone]) or go to [facility] if:  You begin to have strong, frequent contractions  Your water breaks.  Sometimes it is a big gush of fluid, sometimes it is just a trickle that keeps getting your panties wet or running down your legs  You have vaginal bleeding.  It is normal to have a small amount of spotting if your cervix was checked.   You don't feel your baby moving like normal.  If you don't, get you something to eat and drink and lay down and focus on feeling your baby move.  You should feel at least 10 movements in 2 hours.  If you don't, you should call the office or go to [facility].    Preterm Labor Information Preterm labor is when labor starts at less than 37 weeks of pregnancy. The normal length of a pregnancy is 39 to 41 weeks. CAUSES Often, there is no identifiable underlying cause as to why a woman goes into preterm labor. One of the most common known causes of preterm labor is infection. Infections of the uterus, cervix, vagina, amniotic sac, bladder, kidney, or even the lungs (pneumonia) can cause labor to start. Other suspected causes of preterm labor include:   Urogenital infections, such as yeast infections and bacterial vaginosis.   Uterine abnormalities (uterine shape, uterine septum, fibroids, or bleeding from the placenta).   A cervix that has been operated on (it may fail to stay closed).   Malformations in the fetus.   Multiple gestations (twins, triplets, and so on).   Breakage of the amniotic sac.  RISK FACTORS  Having a previous history of preterm labor.   Having premature rupture of membranes (PROM).   Having a placenta that covers the opening of the cervix (placenta previa).   Having a placenta that separates from the uterus (placental abruption).   Having a cervix that is too weak to hold the fetus in the uterus (incompetent cervix).   Having too much fluid in the amniotic sac (polyhydramnios).   Taking  illegal drugs or smoking while pregnant.   Not gaining enough weight while pregnant.   Being younger than 18 and older than 21 years old.   Having a low socioeconomic status.   Being African American. SYMPTOMS Signs and symptoms of preterm labor include:   Menstrual-like cramps, abdominal pain, or back pain.  Uterine contractions that are regular, as frequent as six in an hour, regardless of their intensity (may be mild or painful).  Contractions that start on the top of the uterus and spread down to the lower abdomen and back.   A sense of increased pelvic pressure.   A watery or bloody mucus discharge that comes from the vagina.  TREATMENT Depending on the length of the pregnancy and other circumstances, your health care provider may suggest bed rest. If necessary, there are medicines that can be given to stop contractions and to mature the fetal lungs. If labor happens before 34 weeks of pregnancy, a prolonged hospital stay may be recommended. Treatment depends on the condition of both you and the fetus.  WHAT SHOULD YOU DO IF YOU THINK YOU ARE IN PRETERM LABOR? Call your health care provider right away. You will need to go to the hospital to get checked immediately. HOW CAN YOU PREVENT PRETERM LABOR IN FUTURE PREGNANCIES? You should:   Stop smoking if you smoke.  Maintain healthy weight gain and avoid chemicals and drugs that are not necessary.  Be watchful for   any type of infection.  Inform your health care provider if you have a known history of preterm labor.   This information is not intended to replace advice given to you by your health care provider. Make sure you discuss any questions you have with your health care provider.   Document Released: 05/02/2003 Document Revised: 10/12/2012 Document Reviewed: 03/14/2012 Elsevier Interactive Patient Education 2016 Elsevier Inc.  

## 2015-03-05 NOTE — Progress Notes (Signed)
Low-risk OB appointment G1P0 5061w2d Estimated Date of Delivery: 04/14/15 BP 112/70 mmHg  Pulse 76  Wt 177 lb (80.287 kg)  LMP 07/08/2014 (Exact Date)  BP, weight, and urine reviewed.  Refer to obstetrical flow sheet for FH & FHR.  Reports good fm.  Denies regular uc's, lof, vb, or uti s/s. No complaints. Reviewed ptl s/s, fkc. Plan:  Continue routine obstetrical care  F/U in 2wks for OB appointment

## 2015-03-19 ENCOUNTER — Ambulatory Visit (INDEPENDENT_AMBULATORY_CARE_PROVIDER_SITE_OTHER): Payer: Managed Care, Other (non HMO) | Admitting: Women's Health

## 2015-03-19 ENCOUNTER — Encounter: Payer: Self-pay | Admitting: Women's Health

## 2015-03-19 VITALS — BP 112/76 | HR 68 | Wt 187.0 lb

## 2015-03-19 DIAGNOSIS — Z331 Pregnant state, incidental: Secondary | ICD-10-CM

## 2015-03-19 DIAGNOSIS — Z3403 Encounter for supervision of normal first pregnancy, third trimester: Secondary | ICD-10-CM

## 2015-03-19 DIAGNOSIS — Z1389 Encounter for screening for other disorder: Secondary | ICD-10-CM

## 2015-03-19 LAB — POCT URINALYSIS DIPSTICK
Blood, UA: NEGATIVE
GLUCOSE UA: NEGATIVE
Ketones, UA: NEGATIVE
LEUKOCYTES UA: NEGATIVE
NITRITE UA: NEGATIVE
Protein, UA: NEGATIVE

## 2015-03-19 NOTE — Patient Instructions (Signed)
Call the office ([phone]) or go to [facility] if:  You begin to have strong, frequent contractions  Your water breaks.  Sometimes it is a big gush of fluid, sometimes it is just a trickle that keeps getting your panties wet or running down your legs  You have vaginal bleeding.  It is normal to have a small amount of spotting if your cervix was checked.   You don't feel your baby moving like normal.  If you don't, get you something to eat and drink and lay down and focus on feeling your baby move.  You should feel at least 10 movements in 2 hours.  If you don't, you should call the office or go to [facility].    Preterm Labor Information Preterm labor is when labor starts at less than 37 weeks of pregnancy. The normal length of a pregnancy is 39 to 41 weeks. CAUSES Often, there is no identifiable underlying cause as to why a woman goes into preterm labor. One of the most common known causes of preterm labor is infection. Infections of the uterus, cervix, vagina, amniotic sac, bladder, kidney, or even the lungs (pneumonia) can cause labor to start. Other suspected causes of preterm labor include:   Urogenital infections, such as yeast infections and bacterial vaginosis.   Uterine abnormalities (uterine shape, uterine septum, fibroids, or bleeding from the placenta).   A cervix that has been operated on (it may fail to stay closed).   Malformations in the fetus.   Multiple gestations (twins, triplets, and so on).   Breakage of the amniotic sac.  RISK FACTORS  Having a previous history of preterm labor.   Having premature rupture of membranes (PROM).   Having a placenta that covers the opening of the cervix (placenta previa).   Having a placenta that separates from the uterus (placental abruption).   Having a cervix that is too weak to hold the fetus in the uterus (incompetent cervix).   Having too much fluid in the amniotic sac (polyhydramnios).   Taking  illegal drugs or smoking while pregnant.   Not gaining enough weight while pregnant.   Being younger than 18 and older than 21 years old.   Having a low socioeconomic status.   Being African American. SYMPTOMS Signs and symptoms of preterm labor include:   Menstrual-like cramps, abdominal pain, or back pain.  Uterine contractions that are regular, as frequent as six in an hour, regardless of their intensity (may be mild or painful).  Contractions that start on the top of the uterus and spread down to the lower abdomen and back.   A sense of increased pelvic pressure.   A watery or bloody mucus discharge that comes from the vagina.  TREATMENT Depending on the length of the pregnancy and other circumstances, your health care provider may suggest bed rest. If necessary, there are medicines that can be given to stop contractions and to mature the fetal lungs. If labor happens before 34 weeks of pregnancy, a prolonged hospital stay may be recommended. Treatment depends on the condition of both you and the fetus.  WHAT SHOULD YOU DO IF YOU THINK YOU ARE IN PRETERM LABOR? Call your health care provider right away. You will need to go to the hospital to get checked immediately. HOW CAN YOU PREVENT PRETERM LABOR IN FUTURE PREGNANCIES? You should:   Stop smoking if you smoke.  Maintain healthy weight gain and avoid chemicals and drugs that are not necessary.  Be watchful for   any type of infection.  Inform your health care provider if you have a known history of preterm labor.   This information is not intended to replace advice given to you by your health care provider. Make sure you discuss any questions you have with your health care provider.   Document Released: 05/02/2003 Document Revised: 10/12/2012 Document Reviewed: 03/14/2012 Elsevier Interactive Patient Education 2016 Elsevier Inc.  

## 2015-03-19 NOTE — Progress Notes (Signed)
Low-risk OB appointment G1P0 [redacted]w[redacted]d Estimated Date of Delivery: 04/14/15 BP 112/76 mmHg  Pulse 68  Wt 187 lb (84.823 kg)  LMP 07/08/2014 (Exact Date)  BP, weight, and urine reviewed.  Refer to obstetrical flow sheet for FH & FHR.  Reports good fm.  Denies regular uc's, lof, vb, or uti s/s. No complaints. Reviewed ptl s/s, fkc. Plan:  Continue routine obstetrical care  F/U in 1wk for OB appointment and gbs

## 2015-03-26 ENCOUNTER — Ambulatory Visit (INDEPENDENT_AMBULATORY_CARE_PROVIDER_SITE_OTHER): Payer: Managed Care, Other (non HMO) | Admitting: Women's Health

## 2015-03-26 ENCOUNTER — Encounter: Payer: Self-pay | Admitting: Women's Health

## 2015-03-26 VITALS — BP 112/76 | HR 88 | Wt 189.0 lb

## 2015-03-26 DIAGNOSIS — Z1389 Encounter for screening for other disorder: Secondary | ICD-10-CM

## 2015-03-26 DIAGNOSIS — Z369 Encounter for antenatal screening, unspecified: Secondary | ICD-10-CM

## 2015-03-26 DIAGNOSIS — Z3403 Encounter for supervision of normal first pregnancy, third trimester: Secondary | ICD-10-CM | POA: Diagnosis not present

## 2015-03-26 DIAGNOSIS — Z3A37 37 weeks gestation of pregnancy: Secondary | ICD-10-CM

## 2015-03-26 DIAGNOSIS — Z331 Pregnant state, incidental: Secondary | ICD-10-CM

## 2015-03-26 LAB — POCT URINALYSIS DIPSTICK
Glucose, UA: NEGATIVE
Ketones, UA: NEGATIVE
LEUKOCYTES UA: NEGATIVE
NITRITE UA: NEGATIVE
PROTEIN UA: NEGATIVE
RBC UA: NEGATIVE

## 2015-03-26 NOTE — Patient Instructions (Signed)
Call the office [phone]) or go to [facility] if:   [address] [facility]  You begin to have strong, frequent contractions  Your water breaks.  Sometimes it is a big gush of fluid, sometimes it is just a trickle that keeps getting your panties wet or running down your legs  You have vaginal bleeding.  It is normal to have a small amount of spotting if your cervix was checked.   You don't feel your baby moving like normal.  If you don't, get you something to eat and drink and lay down and focus on feeling your baby move.  You should feel at least 10 movements in 2 hours.  If you don't, you should call the office or go to [facility].    [facility] Contractions Contractions of the uterus can occur throughout pregnancy. Contractions are not always a sign that you are in labor.  WHAT ARE BRAXTON HICKS CONTRACTIONS?  Contractions that occur before labor are called Braxton Hicks contractions, or false labor. Toward the end of pregnancy (32-34 weeks), these contractions can develop more often and may become more forceful. This is not true labor because these contractions do not result in opening (dilatation) and thinning of the cervix. They are sometimes difficult to tell apart from true labor because these contractions can be forceful and people have different pain tolerances. You should not feel embarrassed if you go to the hospital with false labor. Sometimes, the only way to tell if you are in true labor is for your health care provider to look for changes in the cervix. If there are no prenatal problems or other health problems associated with the pregnancy, it is completely safe to be sent home with false labor and await the onset of true labor. HOW CAN YOU TELL THE DIFFERENCE BETWEEN TRUE AND FALSE LABOR? False Labor  The contractions of false labor are usually shorter and not as hard as those of true labor.   The contractions are usually irregular.   The contractions  are often felt in the front of the lower abdomen and in the groin.   The contractions may go away when you walk around or change positions while lying down.   The contractions get weaker and are shorter lasting as time goes on.   The contractions do not usually become progressively stronger, regular, and closer together as with true labor.  True Labor  Contractions in true labor last 30-70 seconds, become very regular, usually become more intense, and increase in frequency.   The contractions do not go away with walking.   The discomfort is usually felt in the top of the uterus and spreads to the lower abdomen and low back.   True labor can be determined by your health care provider with an exam. This will show that the cervix is dilating and getting thinner.  WHAT TO REMEMBER  Keep up with your usual exercises and follow other instructions given by your health care provider.   Take medicines as directed by your health care provider.   Keep your regular prenatal appointments.   Eat and drink lightly if you think you are going into labor.   If Braxton Hicks contractions are making you uncomfortable:   Change your position from lying down or resting to walking, or from walking to resting.   Sit and rest in a tub of warm water.   Drink 2-3 glasses of water. Dehydration may cause these contractions.   Do slow  and deep breathing several times an hour.  WHEN SHOULD I SEEK IMMEDIATE MEDICAL CARE? Seek immediate medical care if:  Your contractions become stronger, more regular, and closer together.   You have fluid leaking or gushing from your vagina.   You have a fever.   You pass blood-tinged mucus.   You have vaginal bleeding.   You have continuous abdominal pain.   You have low back pain that you never had before.   You feel your baby's head pushing down and causing pelvic pressure.   Your baby is not moving as much as it used to.    This  information is not intended to replace advice given to you by your health care provider. Make sure you discuss any questions you have with your health care provider.   Document Released: 02/09/2005 Document Revised: 02/14/2013 Document Reviewed: 11/21/2012 Elsevier Interactive Patient Education [employer]

## 2015-03-26 NOTE — Progress Notes (Signed)
Low-risk OB appointment G1P0 [redacted]w[redacted]d Estimated Date of Delivery: 04/14/15 BP 112/76 mmHg  Pulse 88  Wt 189 lb (85.73 kg)  LMP 07/08/2014 (Exact Date)  BP, weight, and urine reviewed.  Refer to obstetrical flow sheet for FH & FHR.  Reports good fm.  Denies regular uc's, lof, vb, or uti s/s. No complaints. GBS collected SVE per request: 1.5/60/-2, vtx Reviewed labor s/s, fkc. Plan:  Continue routine obstetrical care  F/U in 1wk for OB appointment

## 2015-03-28 LAB — GC/CHLAMYDIA PROBE AMP
Chlamydia trachomatis, NAA: NEGATIVE
NEISSERIA GONORRHOEAE BY PCR: NEGATIVE

## 2015-03-28 LAB — STREP GP B NAA: STREP GROUP B AG: POSITIVE — AB

## 2015-03-31 ENCOUNTER — Inpatient Hospital Stay (HOSPITAL_COMMUNITY): Payer: Managed Care, Other (non HMO) | Admitting: Anesthesiology

## 2015-03-31 ENCOUNTER — Encounter (HOSPITAL_COMMUNITY): Payer: Self-pay | Admitting: Certified Registered Nurse Anesthetist

## 2015-03-31 ENCOUNTER — Inpatient Hospital Stay (HOSPITAL_COMMUNITY)
Admission: AD | Admit: 2015-03-31 | Discharge: 2015-04-02 | DRG: 767 | Disposition: A | Discharge status: Home / Self Care | Payer: Managed Care, Other (non HMO) | Source: Ambulatory Visit | Attending: Obstetrics & Gynecology | Admitting: Obstetrics & Gynecology

## 2015-03-31 ENCOUNTER — Encounter (HOSPITAL_COMMUNITY): Payer: Self-pay

## 2015-03-31 DIAGNOSIS — O99824 Streptococcus B carrier state complicating childbirth: Secondary | ICD-10-CM | POA: Diagnosis present

## 2015-03-31 DIAGNOSIS — Z3A38 38 weeks gestation of pregnancy: Secondary | ICD-10-CM

## 2015-03-31 DIAGNOSIS — O26893 Other specified pregnancy related conditions, third trimester: Principal | ICD-10-CM | POA: Diagnosis present

## 2015-03-31 DIAGNOSIS — Z6791 Unspecified blood type, Rh negative: Secondary | ICD-10-CM

## 2015-03-31 DIAGNOSIS — IMO0001 Reserved for inherently not codable concepts without codable children: Secondary | ICD-10-CM

## 2015-03-31 LAB — CBC
HEMATOCRIT: 33.1 % — AB (ref 36.0–46.0)
Hemoglobin: 11 g/dL — ABNORMAL LOW (ref 12.0–15.0)
MCH: 31.1 pg (ref 26.0–34.0)
MCHC: 33.2 g/dL (ref 30.0–36.0)
MCV: 93.5 fL (ref 78.0–100.0)
Platelets: 298 10*3/uL (ref 150–400)
RBC: 3.54 MIL/uL — AB (ref 3.87–5.11)
RDW: 13.4 % (ref 11.5–15.5)
WBC: 18 10*3/uL — AB (ref 4.0–10.5)

## 2015-03-31 LAB — RPR: RPR: NONREACTIVE

## 2015-03-31 LAB — POCT FERN TEST
POCT FERN TEST: POSITIVE
POCT Fern Test: POSITIVE

## 2015-03-31 MED ORDER — PRENATAL MULTIVITAMIN CH
1.0000 | ORAL_TABLET | Freq: Every day | ORAL | Status: DC
  Administered 2015-04-01 – 2015-04-02 (×2): 1 via ORAL
  Filled 2015-03-31 (×2): qty 1

## 2015-03-31 MED ORDER — SENNOSIDES-DOCUSATE SODIUM 8.6-50 MG PO TABS
2.0000 | ORAL_TABLET | ORAL | Status: DC
  Administered 2015-03-31: 2 via ORAL
  Filled 2015-03-31 (×2): qty 2

## 2015-03-31 MED ORDER — DIPHENHYDRAMINE HCL 50 MG/ML IJ SOLN: 12.5000 mg | INTRAMUSCULAR | Status: DC | PRN

## 2015-03-31 MED ORDER — TERBUTALINE SULFATE 1 MG/ML IJ SOLN
0.2500 mg | Freq: Once | INTRAMUSCULAR | Status: DC | PRN
  Filled 2015-03-31: qty 1

## 2015-03-31 MED ORDER — ZOLPIDEM TARTRATE 5 MG PO TABS: 5.0000 mg | ORAL_TABLET | Freq: Every evening | ORAL | Status: DC | PRN

## 2015-03-31 MED ORDER — TETANUS-DIPHTH-ACELL PERTUSSIS 5-2.5-18.5 LF-MCG/0.5 IM SUSP
0.5000 mL | Freq: Once | INTRAMUSCULAR | Status: AC
  Administered 2015-04-01: 0.5 mL via INTRAMUSCULAR
  Filled 2015-03-31: qty 0.5

## 2015-03-31 MED ORDER — CEFAZOLIN SODIUM-DEXTROSE 2-3 GM-% IV SOLR
2.0000 g | Freq: Once | INTRAVENOUS | Status: AC
  Administered 2015-03-31: 2 g via INTRAVENOUS

## 2015-03-31 MED ORDER — SIMETHICONE 80 MG PO CHEW: 80.0000 mg | CHEWABLE_TABLET | ORAL | Status: DC | PRN

## 2015-03-31 MED ORDER — OXYTOCIN 10 UNIT/ML IJ SOLN
2.5000 [IU]/h | INTRAVENOUS | Status: DC
  Filled 2015-03-31: qty 4

## 2015-03-31 MED ORDER — MEASLES, MUMPS & RUBELLA VAC ~~LOC~~ INJ
0.5000 mL | INJECTION | Freq: Once | SUBCUTANEOUS | Status: DC
  Filled 2015-03-31: qty 0.5

## 2015-03-31 MED ORDER — OXYCODONE-ACETAMINOPHEN 5-325 MG PO TABS: 1.0000 | ORAL_TABLET | ORAL | Status: DC | PRN

## 2015-03-31 MED ORDER — LANOLIN HYDROUS EX OINT: TOPICAL_OINTMENT | CUTANEOUS | Status: DC | PRN

## 2015-03-31 MED ORDER — ONDANSETRON HCL 4 MG/2ML IJ SOLN: 4.0000 mg | INTRAMUSCULAR | Status: DC | PRN

## 2015-03-31 MED ORDER — FENTANYL CITRATE (PF) 100 MCG/2ML IJ SOLN: 100.0000 ug | INTRAMUSCULAR | Status: DC | PRN

## 2015-03-31 MED ORDER — IBUPROFEN 600 MG PO TABS
600.0000 mg | ORAL_TABLET | Freq: Four times a day (QID) | ORAL | Status: DC
  Administered 2015-03-31 – 2015-04-02 (×8): 600 mg via ORAL
  Filled 2015-03-31 (×8): qty 1

## 2015-03-31 MED ORDER — DIPHENHYDRAMINE HCL 25 MG PO CAPS
25.0000 mg | ORAL_CAPSULE | Freq: Four times a day (QID) | ORAL | Status: DC | PRN
Start: 2015-03-31 — End: 2015-04-02

## 2015-03-31 MED ORDER — DIBUCAINE 1 % RE OINT: 1.0000 "application " | TOPICAL_OINTMENT | RECTAL | Status: DC | PRN

## 2015-03-31 MED ORDER — FENTANYL 2.5 MCG/ML BUPIVACAINE 1/10 % EPIDURAL INFUSION (WH - ANES)
14.0000 mL/h | INTRAMUSCULAR | Status: DC | PRN
  Administered 2015-03-31: 14 mL/h via EPIDURAL
  Filled 2015-03-31: qty 125

## 2015-03-31 MED ORDER — OXYTOCIN 10 UNIT/ML IJ SOLN
1.0000 m[IU]/min | INTRAVENOUS | Status: DC
  Administered 2015-03-31: 2 m[IU]/min via INTRAVENOUS

## 2015-03-31 MED ORDER — BENZOCAINE-MENTHOL 20-0.5 % EX AERO: 1.0000 "application " | INHALATION_SPRAY | CUTANEOUS | Status: DC | PRN

## 2015-03-31 MED ORDER — CEFAZOLIN SODIUM-DEXTROSE 2-3 GM-% IV SOLR
INTRAVENOUS | Status: AC
  Filled 2015-03-31: qty 50

## 2015-03-31 MED ORDER — ACETAMINOPHEN 325 MG PO TABS: 650.0000 mg | ORAL_TABLET | ORAL | Status: DC | PRN

## 2015-03-31 MED ORDER — EPHEDRINE 5 MG/ML INJ
10.0000 mg | INTRAVENOUS | Status: DC | PRN
  Filled 2015-03-31: qty 2

## 2015-03-31 MED ORDER — PENICILLIN G POTASSIUM 5000000 UNITS IJ SOLR
5.0000 10*6.[IU] | Freq: Once | INTRAVENOUS | Status: AC
  Administered 2015-03-31: 5 10*6.[IU] via INTRAVENOUS
  Filled 2015-03-31: qty 5

## 2015-03-31 MED ORDER — LACTATED RINGERS IV SOLN: 500.0000 mL | INTRAVENOUS | Status: DC | PRN

## 2015-03-31 MED ORDER — PENICILLIN G POTASSIUM 5000000 UNITS IJ SOLR
2.5000 10*6.[IU] | INTRAMUSCULAR | Status: DC
  Administered 2015-03-31 (×2): 2.5 10*6.[IU] via INTRAVENOUS
  Filled 2015-03-31 (×5): qty 2.5

## 2015-03-31 MED ORDER — LIDOCAINE HCL (PF) 1 % IJ SOLN
30.0000 mL | INTRAMUSCULAR | Status: DC | PRN
  Filled 2015-03-31: qty 30

## 2015-03-31 MED ORDER — ONDANSETRON HCL 4 MG PO TABS: 4.0000 mg | ORAL_TABLET | ORAL | Status: DC | PRN

## 2015-03-31 MED ORDER — PHENYLEPHRINE 40 MCG/ML (10ML) SYRINGE FOR IV PUSH (FOR BLOOD PRESSURE SUPPORT)
80.0000 ug | PREFILLED_SYRINGE | INTRAVENOUS | Status: DC | PRN
  Filled 2015-03-31: qty 2
  Filled 2015-03-31: qty 20

## 2015-03-31 MED ORDER — LIDOCAINE HCL (PF) 1 % IJ SOLN
INTRAMUSCULAR | Status: DC | PRN
  Administered 2015-03-31: 3 mL
  Administered 2015-03-31: 5 mL via EPIDURAL
  Administered 2015-03-31: 2 mL via EPIDURAL

## 2015-03-31 MED ORDER — CITRIC ACID-SODIUM CITRATE 334-500 MG/5ML PO SOLN
30.0000 mL | ORAL | Status: DC | PRN
  Administered 2015-03-31: 30 mL via ORAL
  Filled 2015-03-31: qty 15

## 2015-03-31 MED ORDER — ONDANSETRON HCL 4 MG/2ML IJ SOLN: 4.0000 mg | Freq: Four times a day (QID) | INTRAMUSCULAR | Status: DC | PRN

## 2015-03-31 MED ORDER — OXYTOCIN BOLUS FROM INFUSION
500.0000 mL | INTRAVENOUS | Status: DC
  Administered 2015-03-31: 500 mL via INTRAVENOUS

## 2015-03-31 MED ORDER — WITCH HAZEL-GLYCERIN EX PADS: 1.0000 "application " | MEDICATED_PAD | CUTANEOUS | Status: DC | PRN

## 2015-03-31 MED ORDER — LACTATED RINGERS IV SOLN
INTRAVENOUS | Status: DC
  Administered 2015-03-31: 08:00:00 via INTRAVENOUS

## 2015-03-31 MED ORDER — OXYCODONE-ACETAMINOPHEN 5-325 MG PO TABS: 2.0000 | ORAL_TABLET | ORAL | Status: DC | PRN

## 2015-03-31 NOTE — H&P (Signed)
LABOR ADMISSION HISTORY AND PHYSICAL  Tammy Price is a 20 y.o. female G1P0 with IUP at [redacted]w[redacted]d presenting for SOL. Pt had ROM at 0330 this morning, followed by contractions 1 hr later.  Fluid was clear with some bloody show.  She reports +FM, no VB. Denies blurry vision, headaches, peripheral edema, or RUQ pain.  She plans on breast feeding. She request mirena IUD for birth control.  Dating: By LMP c/w 8wk u/s --->  Estimated Date of Delivery: 04/14/15  Sono:     20+1wks,measurements c/w dates,EFW 356g, normal ov's bilat,cephalic,cx 4.1cm,post pl gr 0,fhr 142 bpm,svp of fluid 4.6cm,anatomy complete no obvious abn seen.   Prenatal History/Complications: Rh neg - rhogam at 28w Varicella non-immune GBS pos  Past Medical History: Past Medical History  Diagnosis Date  . Bipolar affect, depressed (HCC)     Past Surgical History: Past Surgical History  Procedure Laterality Date  . No past surgeries      Obstetrical History: OB History    Gravida Para Term Preterm AB TAB SAB Ectopic Multiple Living   1               Social History: Social History   Social History  . Marital Status: Single    Spouse Name: N/A  . Number of Children: N/A  . Years of Education: N/A   Social History Main Topics  . Smoking status: Never Smoker   . Smokeless tobacco: None  . Alcohol Use: No  . Drug Use: No  . Sexual Activity: Yes   Other Topics Concern  . None   Social History Narrative    Family History: Family History  Problem Relation Age of Onset  . Depression Mother   . Cancer Paternal Grandfather   . ALS Other     Allergies: No Known Allergies  Prescriptions prior to admission  Medication Sig Dispense Refill Last Dose  . prenatal vitamin w/FE, FA (PRENATAL 1 + 1) 27-1 MG TABS tablet Take 1 tablet by mouth daily at 12 noon. 30 each 12 Past Week at Unknown time     Review of Systems   All systems reviewed and negative except as stated in HPI  BP 139/76 mmHg   Pulse 89  Temp(Src) 98.4 F (36.9 C) (Oral)  Resp 16  Ht 5' (1.524 m)  Wt 87.176 kg (192 lb 3 oz)  BMI 37.53 kg/m2  SpO2 99%  LMP 07/08/2014 (Exact Date) General appearance: alert, cooperative, appears stated age and no distress Lungs: clear to auscultation bilaterally Heart: regular rate and rhythm Abdomen: soft, non-tender; bowel sounds normal Pelvic: adequate, CE below Extremities: Homans sign is negative, no sign of DVT, edema DTR's nl Presentation: cephalic on bedside u/s Fetal monitoringBaseline: 140 bpm, Variability: Good {> 6 bpm) and Accelerations: Reactive Uterine activityFrequency: Every 4 minutes Dilation: 4 Effacement (%): 70 Station: -1 Exam by:: Tammy Eisenmenger RN   Prenatal labs: ABO, Rh: A/Negative/-- (07/26 1429) Antibody: Negative (11/29 0858) Rubella: !Error!immune RPR: Non Reactive (11/29 0858)  HBsAg: Negative (07/26 1429)  HIV: Non Reactive (11/29 0858)  GBS: Positive (01/31 1300)  2 hr Glucola 76/120/91 Genetic screening  neg Anatomy US nl female  Prenatal Transfer Tool  Maternal Diabetes: No Genetic Screening: Normal Maternal Ultrasounds/Referrals: Normal Fetal Ultrasounds or other Referrals:  None Maternal Substance Abuse:  No Significant Maternal Medications:  None Significant Maternal Lab Results: Lab values include: Group B Strep positive, Rh negative  Results for orders placed or performed during the hospital encounter of 03/31/15 (  from the past 24 hour(s))  Fern Test   Collection Time: 03/31/15  6:36 AM  Result Value Ref Range   POCT Fern Test Positive = ruptured amniotic membanes   Fern Test   Collection Time: 03/31/15  6:38 AM  Result Value Ref Range   POCT Fern Test Positive = ruptured amniotic membanes   CBC   Collection Time: 03/31/15  8:00 AM  Result Value Ref Range   WBC 18.0 (H) 4.0 - 10.5 K/uL   RBC 3.54 (L) 3.87 - 5.11 MIL/uL   Hemoglobin 11.0 (L) 12.0 - 15.0 g/dL   HCT 96.0 (L) 45.4 - 09.8 %   MCV 93.5 78.0 - 100.0 fL    MCH 31.1 26.0 - 34.0 pg   MCHC 33.2 30.0 - 36.0 g/dL   RDW 11.9 14.7 - 82.9 %   Platelets 298 150 - 400 K/uL    Patient Active Problem List   Diagnosis Date Noted  . Active labor at term 03/31/2015  . Rh negative state in antepartum period 09/19/2014  . Susceptible to varicella (non-immune), currently pregnant 09/19/2014  . Supervision of normal first pregnancy 09/18/2014    Assessment: Tammy Price is a 20 y.o. G1P0 at [redacted]w[redacted]d here for SOL - ROM at 0330 with onset ctx <1hr.   # Labor: early active, SOL -admit to L&D with routine orders  -expectant mgmt  #FWB: Category 1, reactive  -Continue monitoring   # Pain Control:  -analgesia prn -would like epidural  # GBS: pos -PCN  # Postpartum plan:  -Feeding: Br -Contraception: mirena IUD -Circ: inpt   Wynne Dust, MD, PGY-1  OB   I spoke with and examined patient and agree with resident/PA/SNM's note and plan of care.  Cheral Marker, CNM, [facility] 04/01/2015 9:42 AM

## 2015-03-31 NOTE — Anesthesia Preprocedure Evaluation (Signed)
Anesthesia Evaluation  Patient identified by MRN, date of birth, ID band Patient awake    Reviewed: Allergy & Precautions, Patient's Chart, lab work & pertinent test results  Airway Mallampati: III  TM Distance: >3 FB     Dental   Pulmonary neg pulmonary ROS,    breath sounds clear to auscultation       Cardiovascular negative cardio ROS   Rhythm:Regular Rate:Normal     Neuro/Psych negative neurological ROS     GI/Hepatic negative GI ROS, Neg liver ROS,   Endo/Other  negative endocrine ROS  Renal/GU negative Renal ROS     Musculoskeletal   Abdominal   Peds  Hematology negative hematology ROS (+)   Anesthesia Other Findings   Reproductive/Obstetrics (+) Pregnancy                             Lab Results  Component Value Date   WBC 18.0* 03/31/2015   HGB 11.0* 03/31/2015   HCT 33.1* 03/31/2015   MCV 93.5 03/31/2015   PLT 298 03/31/2015    Anesthesia Physical Anesthesia Plan  ASA: II  Anesthesia Plan: Epidural   Post-op Pain Management:    Induction:   Airway Management Planned:   Additional Equipment:   Intra-op Plan:   Post-operative Plan:   Informed Consent: I have reviewed the patients History and Physical, chart, labs and discussed the procedure including the risks, benefits and alternatives for the proposed anesthesia with the patient or authorized representative who has indicated his/her understanding and acceptance.     Plan Discussed with:   Anesthesia Plan Comments:         Anesthesia Quick Evaluation

## 2015-03-31 NOTE — Progress Notes (Signed)
LABOR PROGRESS NOTE  Tammy Price is a 20 y.o. G1P0 at [redacted]w[redacted]d  admitted for srom  Subjective: No pain s/p epidural  Objective: BP 139/77 mmHg  Pulse 95  Temp(Src) 98.5 F (36.9 C) (Oral)  Resp 18  Ht 5' (1.524 m)  Wt 192 lb 3 oz (87.176 kg)  BMI 37.53 kg/m2  SpO2 99%  LMP 07/08/2014 (Exact Date) or  Filed Vitals:   03/31/15 0905 03/31/15 0906 03/31/15 0907 03/31/15 0911  BP:  124/75 124/75 139/77  Pulse: 104 105 105 95  Temp:      TempSrc:      Resp:      Height:      Weight:      SpO2: 99%  99%     135/mod/+a/-d  Dilation: 3.5 Effacement (%): 90 Cervical Position: Middle Station: -3 Presentation: Vertex Exam by:: Sharnay Cashion  Labs: Lab Results  Component Value Date   WBC 18.0* 03/31/2015   HGB 11.0* 03/31/2015   HCT 33.1* 03/31/2015   MCV 93.5 03/31/2015   PLT 298 03/31/2015    Patient Active Problem List   Diagnosis Date Noted  . Active labor at term 03/31/2015  . Rh negative state in antepartum period 09/19/2014  . Susceptible to varicella (non-immune), currently pregnant 09/19/2014  . Supervision of normal first pregnancy 09/18/2014    Assessment / Plan: 20 y.o. G1P0 at [redacted]w[redacted]d here for srom  Labor: latent. No change 2 hours since admit. Will start pitocin. Fetal Wellbeing:  Cat 1 Pain Control:  S/p epidural Anticipated MOD:  Vag GBS pos: penicillin first dose 08:00  Silvano Bilis, MD 03/31/2015, 9:16 AM

## 2015-03-31 NOTE — MAU Note (Signed)
3:30 am water broke - clear fluid.  Bloody mucus.  Baby moving well.  Contractions every 5 min.  1.5 cm in office.

## 2015-03-31 NOTE — Anesthesia Procedure Notes (Signed)
Epidural Patient location during procedure: OB  Staffing Anesthesiologist: Marcene Duos Performed by: anesthesiologist   Preanesthetic Checklist Completed: patient identified, site marked, surgical consent, pre-op evaluation, timeout performed, IV checked, risks and benefits discussed and monitors and equipment checked  Epidural Patient position: sitting Prep: site prepped and draped and DuraPrep Patient monitoring: continuous pulse ox and blood pressure Approach: midline Location: L4-L5 Injection technique: LOR saline  Needle:  Needle type: Tuohy  Needle gauge: 17 G Needle length: 9 cm and 9 Needle insertion depth: 8 cm Catheter type: closed end flexible Catheter size: 19 Gauge Catheter at skin depth: 13 cm Test dose: negative  Assessment Events: blood not aspirated, injection not painful, no injection resistance, negative IV test and no paresthesia

## 2015-03-31 NOTE — Progress Notes (Signed)
Dr Audrea Muscat notified of pt's admission and status. Aware of srom at 0330 with cl fld and some bloody mucous, fern pos, irreg ctx. Will admit to BS.

## 2015-04-01 MED ORDER — RHO D IMMUNE GLOBULIN 1500 UNIT/2ML IJ SOSY
300.0000 ug | PREFILLED_SYRINGE | Freq: Once | INTRAMUSCULAR | Status: AC
  Administered 2015-04-01: 300 ug via INTRAMUSCULAR
  Filled 2015-04-01: qty 2

## 2015-04-01 NOTE — Anesthesia Postprocedure Evaluation (Signed)
Anesthesia Post Note  Patient: Tammy Price  Procedure(s) Performed: * No procedures listed *  Patient location during evaluation: Mother Baby Anesthesia Type: Epidural Level of consciousness: awake Pain management: pain level controlled Vital Signs Assessment: post-procedure vital signs reviewed and stable Respiratory status: spontaneous breathing Cardiovascular status: stable Postop Assessment: no headache, no backache, epidural receding, patient able to bend at knees, no signs of nausea or vomiting and adequate PO intake Anesthetic complications: no (states pain level 4 and that her pain relief expectaion has been met)    Last Vitals:  Filed Vitals:   04/01/15 0515 04/01/15 0800  BP: 95/48 97/56  Pulse: 78 87  Temp: 36.8 C 36.9 C  Resp: 18 16    Last Pain:  Filed Vitals:   04/01/15 0828  PainSc: 4                  Everette Rank

## 2015-04-01 NOTE — Lactation Note (Signed)
This note was copied from the chart of Tammy Price. Lactation Consultation Note  Patient Name: Tammy Faithe Ariola [id number] Date: 04/01/2015 Reason for consult: Follow-up assessment Baby at 27 hr of life and RN worried that baby is not maintaining latch, he is too sleepy. Mom has small wide spaced breast with short small nipples. She denies breast changes during pregnancy or any medical Hx that would interfere with bf. She has been able to manually express drops of colostrum and spoon feed baby today. Baby has a thick upper labial frenulum and a high palate. He can extend tongue past gum ridge, lift tongue past midline, and had good lateralization of tongue. Mom was able to latch baby to the L breast in the football position. Encouraged mom to bf on demand 8+/24 hr and manually express drops to feed back after baby is done latching. Report given to RN.     Maternal Data    Feeding Feeding Type: Breast Fed Length of feed: 10 min  LATCH Score/Interventions Latch: Repeated attempts needed to sustain latch, nipple held in mouth throughout feeding, stimulation needed to elicit sucking reflex. Intervention(s): Skin to skin;Teach feeding cues Intervention(s): Adjust position;Assist with latch;Breast compression  Audible Swallowing: Spontaneous and intermittent Intervention(s): Hand expression;Skin to skin  Type of Nipple: Everted at rest and after stimulation  Comfort (Breast/Nipple): Soft / non-tender     Hold (Positioning): Assistance needed to correctly position infant at breast and maintain latch. Intervention(s): Support Pillows;Position options  LATCH Score: 8  Lactation Tools Discussed/Used     Consult Status Consult Status: Follow-up Date: 04/02/15 Follow-up type: In-patient    Rulon Eisenmenger 04/01/2015, 7:52 PM

## 2015-04-01 NOTE — Lactation Note (Signed)
This note was copied from the chart of Tammy Price. Lactation Consultation Note Went into room and parents were sleeping the first time. RN notified me that they were awake. Went into rm. For [facility] Consult, tech. Had called RN d/t baby had 2 large bloody mucous emesis. Informed mom that I would come back later when baby wasn't so spitty. RN stated he had been spitty since birth.  Patient Name: Tammy Adah Stoneberg [id number] Date: 04/01/2015     Maternal Data    Feeding    LATCH Score/Interventions                      Lactation Tools Discussed/Used     Consult Status      Rayshawn Maney, Diamond Nickel 04/01/2015, 2:12 AM

## 2015-04-01 NOTE — Progress Notes (Signed)
POSTPARTUM PROGRESS NOTE  Post Partum Day 1 Subjective:  Tammy Price is a 20 y.o. G1P1001 [redacted]w[redacted]d s/p nsvd.  No acute events overnight.  Pt denies problems with ambulating, voiding or po intake.  She denies nausea or vomiting.  Pain is well controlled.  She has had flatus. She has not had bowel movement.  Lochia Minimal.   Objective: Blood pressure 95/48, pulse 78, temperature 98.2 F (36.8 C), temperature source Oral, resp. rate 18, height 5' (1.524 m), weight 192 lb 3 oz (87.176 kg), last menstrual period 07/08/2014, SpO2 99 %, unknown if currently breastfeeding.  Physical Exam:  General: alert, cooperative and no distress Lochia:normal flow Chest: CTAB Heart: RRR no m/r/g Abdomen: +BS, soft, nontender,  Uterine Fundus: firm,  DVT Evaluation: No calf swelling or tenderness Extremities: trace edema   Recent Labs  03/31/15 0800  HGB 11.0*  HCT 33.1*    Assessment/Plan:  ASSESSMENT: Tammy Price is a 20 y.o. G1P1001 [redacted]w[redacted]d s/p nsvd, doing well.  Plan for discharge tomorrow   LOS: 1 day   Silvano Bilis 04/01/2015, 6:44 AM

## 2015-04-01 NOTE — Progress Notes (Signed)
Post Partum Day 1  Subjective:  Tammy Price is a 20 y.o. G1P1001 [redacted]w[redacted]d s/p SVD @ 1630.  No acute events overnight.  Pt denies problems with ambulating, voiding or po intake.  She denies nausea or vomiting.  Pain is well controlled.  She has not hadflatus. She has had bowel movement.  Lochia Small.  Plan for birth control is IUD.  Method of Feeding: breast.  Objective: BP 95/48 mmHg  Pulse 78  Temp(Src) 98.2 F (36.8 C) (Oral)  Resp 18  Ht 5' (1.524 m)  Wt 87.176 kg (192 lb 3 oz)  BMI 37.53 kg/m2  SpO2 99%  LMP 07/08/2014 (Exact Date)  Breastfeeding? Unknown  Physical Exam:  General: alert, cooperative and no distress Lochia:normal flow Chest: CTAB Heart: RRR no m/r/g Abdomen: +BS, soft, nontender, fundus firm at/below umbilicus Uterine Fundus: firm DVT Evaluation: No evidence of DVT seen on physical exam. Extremities: 1+ edema bilaterally, w/o any calf tenderness to palpation   Recent Labs  03/31/15 0800  HGB 11.0*  HCT 33.1*    Assessment/Plan:  ASSESSMENT: Tammy Price is a 20 y.o. G1P1001 [redacted]w[redacted]d ppd #1 s/p NSVD doing well.  1. PPD #1- No acute events overnight. Patient endorses small amount of vaginal bleeding that is improving. She was encouraged to ambulate and continue to drink PO fluids. Patient did have manual extraction of retained membranes and was provided with Ancef 2g abx prophylaxis. 2. Breast feeding- Patient plans to breast feed. She is having minimal milk production. Patient educated that it can take 48-72 hours before milk production can began. Patient encouraged to continue to try. 3. IUD- Patient talked with midwife and plans to have Mirena placed for birth control.  Plan for discharge tomorrow   LOS: 1 day   Demetrios Loll 04/01/2015, 7:14 AM

## 2015-04-01 NOTE — Lactation Note (Signed)
This note was copied from the chart of Tammy Elfreda Blanchet. Lactation Consultation Note  Mother holding baby in arms.  Visitors in room. Mother states she has been shown how to hand express and viewed drops. She states the last 2 times she attempted breastfeeding baby he did not latch. Encouraged STS and suggest she hand express some drops and give to baby on spoon to interest him in latching. Suggest she also call for assistance w/ next feeding. Mother and RN state baby has still been spitty. Discussed cluster feeding. Mom made aware of O/P services, breastfeeding support groups, community resources, and our phone # for post-discharge questions.    Patient Name: Tammy Price [id number] Date: 04/01/2015     Maternal Data    Feeding Feeding Type: Breast Fed Length of feed: 0 min  LATCH Score/Interventions                      Lactation Tools Discussed/Used     Consult Status      Hardie Pulley 04/01/2015, 1:07 PM

## 2015-04-01 NOTE — Addendum Note (Signed)
Addendum  created 04/01/15 0855 by Earmon Phoenix, CRNA   Modules edited: Charges VN, Clinical Notes   Clinical Notes:  File: [id number]

## 2015-04-01 NOTE — Clinical Social Work Maternal (Signed)
CLINICAL SOCIAL WORK MATERNAL/CHILD NOTE  Patient Details  Name: Tammy Price MRN: [id number] Date of Birth: 02/19/1996  Date:  04/01/2015  Clinical Social Worker Initiating Note:  Loleta Books MSW, LCSW Date/ Time Initiated:  04/01/15/1110    Child's Name:  Tammy Price   Legal Guardian:  Carmela Hurt and Alex  Need for Interpreter:  None   Date of Referral:  03/31/15     Reason for Referral:  History of bipolar  Referral Source:  [facility]   Address:  [address] [place], [place] [address]  Phone number:  [phone]   Household Members:  Significant Other   Natural Supports (not living in the home):  Immediate Family, Extended Family   Professional Supports: None   Employment:     Type of Work:     Education:      Architect:  [employer]   Other Resources:  Sales executive , [employer]   Cultural/Religious Considerations Which May Impact Care:  None reported  Strengths:  Ability to meet basic needs , Merchandiser, retail , Home prepared for child    Risk Factors/Current Problems:  None   Cognitive State:  Able to Concentrate , Alert , Goal Oriented , Linear Thinking    Mood/Affect:  Bright , Happy , Comfortable , Calm    CSW Assessment:  CSW received request for consult due to MOB presenting with a history of bipolar.  MOB provided consent for FOB to remain in the room during the assessment.  The MOB was in a pleasant mood, and was observed to be smiling during the entire assessment.  MOB and FOB were both quiet, and only answered questions when directly prompted.  MOB's answers were short, but they presented as attentive and engaged with education on provided. No acute mental health symptoms observed or noted in MOB's thought process.   MOB endorsed feelings of happiness and excitement secondary to the infant's birth. MOB stated that they did not have a specific birth plan, and only wanted the FOB to be able to cut the cord.  MOB and FOB shared that they  are happy and pleased with their childbirth experience and now transition postpartum.  MOB stated that she is going to attempt to breastfeed, but stated that infant continues to be "sleepy".  MOB shared that she has a positive and strong support system, and reported that they have family and friends that live nearby who will be helping out as they transition home.  MOB endorsed feeling excited about her new role transition to motherhood, and confirmed that the home is prepared for the infant.   MOB denied mental health complications during the pregnancy. MOB confirmed history of bipolar, but stated that it was "years ago".  MOB stated that she has not been on any medications for past 3-4 years, and shared that she has not experienced any complications without medications.   MOB and FOB presented as attentive and engaged as CSW provided education on the [facility] and common signs and symptoms of perinatal mood disorders. CSW highlighted MOB's increased risk due to prior mental health history, and reviewed self-care activities to support her mental health.  MOB and FOB agreed to follow up with MOB's medical provider if they onset of symptoms, and acknowledged presence of evidenced based treatments to address symptoms if they occur.   MOB and FOB denied questions, concerns, or needs at this time. They acknowledged ongoing CSW availabiltiy, and agreed to contact CSW if additional needs arise.  CSW Plan/Description:   1. Patient/Family Education-- Perinatal mood and anxiety disorders 2. No Further Intervention Required/No Barriers to Discharge    Kelby Fam 04/01/2015, 12:18 PM

## 2015-04-02 ENCOUNTER — Encounter: Payer: Managed Care, Other (non HMO) | Admitting: Obstetrics & Gynecology

## 2015-04-02 LAB — RH IG WORKUP (INCLUDES ABO/RH)
ABO/RH(D): A NEG
Fetal Screen: NEGATIVE
GESTATIONAL AGE(WKS): 38
Unit division: 0

## 2015-04-02 MED ORDER — IBUPROFEN 600 MG PO TABS: 600.0000 mg | ORAL_TABLET | Freq: Four times a day (QID) | ORAL | Status: AC

## 2015-04-02 NOTE — Procedures (Deleted)
Procedure: Newborn Female Circumcision using a GOMCO device  Indication: Parental request  EBL: Minimal  Complications: None immediate  Anesthesia: 1% lidocaine local, oral sucrose  Parent desires circumcision for her female infant.  Circumcision procedure details, risks, and benefits discussed, and written informed consent obtained. Risks/benefits include but are not limited to: benefits of circumcision in men include reduction in the rates of urinary tract infection (UTI), penile cancer, some sexually transmitted infections, penile inflammatory and retractile disorders, as well as easier hygiene; risks include bleeding, infection, injury of glans which may lead to penile deformity or urinary tract issues, unsatisfactory cosmetic appearance, and other potential complications related to the procedure.  It was emphasized that this is an elective procedure.    Procedure in detail:  A dorsal penile nerve block was performed with 1% lidocaine without epinephrine.  The area was then cleaned with betadine and draped in sterile fashion.  Two hemostats were applied at the 3 o'clock and 9 o'clock positions on the foreskin.  While maintaining traction, a third hemostat was used to sweep around the glans the release adhesions between the glans and the inner layer of mucosa avoiding the 6 o'clock position.  The hemostat was then clamped at the 12 o'clock position in the midline, approximately half the distance to the corona.  The hemostat was then removed and scissors were used to cut along the crushed skin to its most distal point. The foreskin was retracted over the glans removing any additional adhesions with the probe as needed. The foreskin was then placed back over the glans and the  1.3 cm GOMCO bell was inserted over the glans. The two hemostats were removed, with one hemostat holding the foreskin and underlying mucosa.  The clamp was then attached, and after verifying that the dorsal slit rested superior to the  interface between the bell and base plate, the nut was tightened and the foreskin crushed between the bell and the base plate. This was held in place for 5 minutes with excision of the foreskin atop the base plate with the scalpel.  The thumbscrew was then loosened, base plate removed, and then the bell removed with gentle traction.  The area was inspected and found to be hemostatic.  A piece of gelfoam was then applied to the cut edge of the foreskin.     Palma Holter MD 04/02/2015 11:30 AM

## 2015-04-02 NOTE — Discharge Summary (Signed)
OB Discharge Summary  Patient Name: Tammy Price DOB: Jan 10, 1996 MRN: [id number]  Date of admission: 03/31/2015 Delivering MD: Shonna Chock [place]   Date of discharge: 04/02/2015  Admitting diagnosis: 38WKS, water broke Intrauterine pregnancy: [redacted]w[redacted]d     Secondary diagnosis:Active Problems:   Active labor at term  Additional problems:none     Discharge diagnosis: Term Pregnancy Delivered                                                                     Post partum procedures:manuel extraction retained membranes  Augmentation: Pitocin  Complications: None  Hospital course:  Onset of Labor With Vaginal Delivery     20 y.o. yo G1P1001 at [redacted]w[redacted]d was admitted in Active Labor on 03/31/2015. Patient had an uncomplicated labor course as follows:  Membrane Rupture Time/Date: 3:30 AM ,03/31/2015   Intrapartum Procedures: Episiotomy: None [1]                                         Lacerations:  None [1]  Patient had a delivery of a Viable infant. 03/31/2015  Information for the patient's newborn:  Drue, Camera [[id number]]  Delivery Method: Vaginal, Spontaneous Delivery (Filed from Delivery Summary)    Pateint had an uncomplicated postpartum course.  She is ambulating, tolerating a regular diet, passing flatus, and urinating well. Patient is discharged home in stable condition on 04/02/2015.    Physical exam  Filed Vitals:   04/01/15 0515 04/01/15 0800 04/01/15 1729 04/02/15 0645  BP: 95/48 97/56 135/80 136/72  Pulse: 78 87 90 92  Temp: 98.2 F (36.8 C) 98.4 F (36.9 C) 97.5 F (36.4 C) 98 F (36.7 C)  TempSrc: Oral Oral Oral Oral  Resp: Height:      Weight:      SpO2:       General: alert, cooperative and no distress Lochia: appropriate Uterine Fundus: firm Incision: N/A DVT Evaluation: No evidence of DVT seen on physical exam. No cords or calf tenderness. No significant calf/ankle edema. Labs: Lab Results  Component Value Date   WBC 18.0*  03/31/2015   HGB 11.0* 03/31/2015   HCT 33.1* 03/31/2015   MCV 93.5 03/31/2015   PLT 298 03/31/2015   CMP Latest Ref Rng 08/17/2011  Glucose 70 - 99 mg/dL 89  BUN 6 - 23 mg/dL 14  Creatinine 7.82 - 9.56 mg/dL 2.13  Sodium 086 - 578 mEq/L 137  Potassium 3.5 - 5.1 mEq/L 3.7  Chloride 96 - 112 mEq/L 101  CO2 19 - 32 mEq/L 27  Calcium 8.4 - 10.5 mg/dL 9.8  Total Protein 6.0 - 8.3 g/dL 7.8  Total Bilirubin 0.3 - 1.2 mg/dL 0.4  Alkaline Phos 47 - 119 U/L 86  AST 0 - 37 U/L 22  ALT 0 - 35 U/L 19    Discharge instruction: per After Visit Summary and "Baby and Me Booklet".  After Visit Meds:    Medication List    ASK your doctor about these medications        prenatal vitamin w/FE, FA 27-1 MG Tabs tablet  Take 1  tablet by mouth daily at 12 noon.        Diet: routine diet  Activity: Advance as tolerated. Pelvic rest for 6 weeks.   Outpatient follow up:6 weeks Follow up Appt:No future appointments. Follow up visit: No Follow-up on file.  Postpartum contraception: IUD Mirena  Newborn Data: Live born female  Birth Weight: 6 lb 10 oz (3005 g) APGAR: 9, 9  Baby Feeding: Breast Disposition:home with mother   04/02/2015 Ferdie Ping, CNM

## 2015-04-02 NOTE — Lactation Note (Signed)
This note was copied from the chart of Tammy Jozey Janco. Lactation Consultation Note  Patient Name: Tammy Price [id number] Date: 04/02/2015 Reason for consult: Follow-up assessment Mom reports baby falling asleep after 10 minutes on the breast. LC assisted Mom with positioning to obtain more depth with latch. Reviewed stimulating baby to keep actively nursing. Baby demonstrated few good suckling bursts with few swallowing motions noted. Baby has been to the breast 7 times with 3 additional attempts in past 24 hours. 5 voids/3 stools in 24 hours. Weight loss at 5.8%. Mom does not report breast changes early pregnancy but reports regular menstrual cycles and no history of infertility. Mom has small breast but glandular tissue palpable and colostrum present with hand expression. Advised Mom baby should be at the breast with feeding ques but at least 8-12 times in 24 hours. Try to keep baby nursing for 15-20 minutes both breasts some feedings. Harmony Hand pump given with instructions and advised to post pump for 15 minutes to encourage milk production and give baby back any amount of EBM she receives. Cluster feeding discussed. Engorgement care reviewed if needed. Advised to monitor void/stools, refer to Baby N Me booket page 24. Advised of OP services and support group. Encouraged to call for questions/concerns. Mom reports Peds f/u on Thursday.   Maternal Data    Feeding Feeding Type: Breast Fed Length of feed: 15 min  LATCH Score/Interventions Latch: Grasps breast easily, tongue down, lips flanged, rhythmical sucking. Intervention(s): Adjust position;Assist with latch;Breast massage;Breast compression  Audible Swallowing: A few with stimulation  Type of Nipple: Everted at rest and after stimulation  Comfort (Breast/Nipple): Soft / non-tender     Hold (Positioning): Assistance needed to correctly position infant at breast and maintain latch. Intervention(s): Breastfeeding basics  reviewed;Support Pillows;Position options;Skin to skin  LATCH Score: 8  Lactation Tools Discussed/Used Tools: Pump Breast pump type: Manual   Consult Status Consult Status: Complete Date: 04/02/15 Follow-up type: In-patient    Alfred Levins 04/02/2015, 11:10 AM

## 2015-04-04 LAB — TYPE AND SCREEN
ABO/RH(D): A NEG
Antibody Screen: POSITIVE
DAT, IgG: NEGATIVE
UNIT DIVISION: 0
UNIT DIVISION: 0

## 2015-05-06 ENCOUNTER — Telehealth: Payer: Self-pay | Admitting: Women's Health

## 2015-05-06 NOTE — Telephone Encounter (Signed)
Pt requesting a note to return to work on 05/11/2015 because this is when her disability ends. Pt work note completed and left at front desk for pick up.

## 2015-05-14 ENCOUNTER — Ambulatory Visit: Payer: Managed Care, Other (non HMO) | Admitting: Women's Health

## 2015-05-21 ENCOUNTER — Ambulatory Visit (INDEPENDENT_AMBULATORY_CARE_PROVIDER_SITE_OTHER): Payer: Managed Care, Other (non HMO) | Admitting: Women's Health

## 2015-05-21 ENCOUNTER — Encounter: Payer: Self-pay | Admitting: Women's Health

## 2015-05-21 NOTE — Progress Notes (Signed)
Patient ID: Tammy Price, female   DOB: 1995/06/22, 20 y.o.   MRN: [id number] Subjective:    Tammy SaasKayla B Bernet is a 20 y.o. 411P1001 Caucasian female who presents for a postpartum visit. She is 6 weeks postpartum following a spontaneous vaginal delivery at 38.0 gestational weeks. Anesthesia: epidural. I have fully reviewed the prenatal and intrapartum course. Postpartum course has been uncomplicated. Baby's course has been uncomplicated. Baby is feeding by bottle. Bleeding thin lochia. Bowel function is normal. Bladder function is normal. Patient is not sexually active. Last sexual activity: prior to birth of baby. Contraception method is abstinence and wants IUD. Postpartum depression screening: negative. Score 4.  Last pap just turned 20yo in Jan.  The following portions of the patient's history were reviewed and updated as appropriate: allergies, current medications, past medical history, past surgical history and problem list.  Review of Systems Pertinent items are noted in HPI.   Filed Vitals:   05/21/15 1058  BP: 122/68  Pulse: 76  Weight: 172 lb (78.019 kg)   No LMP recorded.  Objective:   General:  alert, cooperative and no distress   Breasts:  deferred, no complaints  Lungs: clear to auscultation bilaterally  Heart:  regular rate and rhythm  Abdomen: soft, nontender   Vulva: normal  Vagina: normal vagina  Cervix:  closed  Corpus: Well-involuted  Adnexa:  Non-palpable  Rectal Exam: No hemorrhoids        Assessment:   Postpartum exam 6 wks s/p SVB Bottlefeeding Depression screening Contraception counseling  Needs pap  Plan:   Contraception: abstinence until IUD insertion Follow up in: 1 week for IUD insertion, will plan pap with 4wk IUD check  Marge DuncansBooker, Elika Godar Randall CNM, WHNP-BC 05/21/2015 11:10 AM

## 2015-05-21 NOTE — Patient Instructions (Signed)
NO SEX UNTIL AFTER YOU GET YOUR BIRTH CONTROL   Levonorgestrel intrauterine device (IUD) What is this medicine? LEVONORGESTREL IUD (LEE voe nor jes trel) is a contraceptive (birth control) device. The device is placed inside the uterus by a healthcare professional. It is used to prevent pregnancy and can also be used to treat heavy bleeding that occurs during your period. Depending on the device, it can be used for 3 to 5 years. This medicine may be used for other purposes; ask your health care provider or pharmacist if you have questions. What should I tell my health care provider before I take this medicine? They need to know if you have any of these conditions: -abnormal Pap smear -cancer of the breast, uterus, or cervix -diabetes -endometritis -genital or pelvic infection now or in the past -have more than one sexual partner or your partner has more than one partner -heart disease -history of an ectopic or tubal pregnancy -immune system problems -IUD in place -liver disease or tumor -problems with blood clots or take blood-thinners -use intravenous drugs -uterus of unusual shape -vaginal bleeding that has not been explained -an unusual or allergic reaction to levonorgestrel, other hormones, silicone, or polyethylene, medicines, foods, dyes, or preservatives -pregnant or trying to get pregnant -breast-feeding How should I use this medicine? This device is placed inside the uterus by a health care professional. Talk to your pediatrician regarding the use of this medicine in children. Special care may be needed. Overdosage: If you think you have taken too much of this medicine contact a poison control center or emergency room at once. NOTE: This medicine is only for you. Do not share this medicine with others. What if I miss a dose? This does not apply. What may interact with this medicine? Do not take this medicine with any of the following  medications: -amprenavir -bosentan -fosamprenavir This medicine may also interact with the following medications: -aprepitant -barbiturate medicines for inducing sleep or treating seizures -bexarotene -griseofulvin -medicines to treat seizures like carbamazepine, ethotoin, felbamate, oxcarbazepine, phenytoin, topiramate -modafinil -pioglitazone -rifabutin -rifampin -rifapentine -some medicines to treat HIV infection like atazanavir, indinavir, lopinavir, nelfinavir, tipranavir, ritonavir -St. John's wort -warfarin This list may not describe all possible interactions. Give your health care provider a list of all the medicines, herbs, non-prescription drugs, or dietary supplements you use. Also tell them if you smoke, drink alcohol, or use illegal drugs. Some items may interact with your medicine. What should I watch for while using this medicine? Visit your doctor or health care professional for regular check ups. See your doctor if you or your partner has sexual contact with others, becomes HIV positive, or gets a sexual transmitted disease. This product does not protect you against HIV infection (AIDS) or other sexually transmitted diseases. You can check the placement of the IUD yourself by reaching up to the top of your vagina with clean fingers to feel the threads. Do not pull on the threads. It is a good habit to check placement after each menstrual period. Call your doctor right away if you feel more of the IUD than just the threads or if you cannot feel the threads at all. The IUD may come out by itself. You may become pregnant if the device comes out. If you notice that the IUD has come out use a backup birth control method like condoms and call your health care provider. Using tampons will not change the position of the IUD and are okay to use during your   period. What side effects may I notice from receiving this medicine? Side effects that you should report to your doctor or  health care professional as soon as possible: -allergic reactions like skin rash, itching or hives, swelling of the face, lips, or tongue -fever, flu-like symptoms -genital sores -high blood pressure -no menstrual period for 6 weeks during use -pain, swelling, warmth in the leg -pelvic pain or tenderness -severe or sudden headache -signs of pregnancy -stomach cramping -sudden shortness of breath -trouble with balance, talking, or walking -unusual vaginal bleeding, discharge -yellowing of the eyes or skin Side effects that usually do not require medical attention (report to your doctor or health care professional if they continue or are bothersome): -acne -breast pain -change in sex drive or performance -changes in weight -cramping, dizziness, or faintness while the device is being inserted -headache -irregular menstrual bleeding within first 3 to 6 months of use -nausea This list may not describe all possible side effects. Call your doctor for medical advice about side effects. You may report side effects to FDA at 1-800-FDA-1088. Where should I keep my medicine? This does not apply. NOTE: This sheet is a summary. It may not cover all possible information. If you have questions about this medicine, talk to your doctor, pharmacist, or health care provider.    2016, Elsevier/Gold Standard. (2011-03-12 13:54:04)  

## 2015-05-28 ENCOUNTER — Encounter: Payer: Self-pay | Admitting: Women's Health

## 2015-05-28 ENCOUNTER — Ambulatory Visit (INDEPENDENT_AMBULATORY_CARE_PROVIDER_SITE_OTHER): Payer: Managed Care, Other (non HMO) | Admitting: Women's Health

## 2015-05-28 VITALS — BP 110/68 | HR 76 | Wt 174.0 lb

## 2015-05-28 DIAGNOSIS — Z30430 Encounter for insertion of intrauterine contraceptive device: Secondary | ICD-10-CM

## 2015-05-28 DIAGNOSIS — Z3202 Encounter for pregnancy test, result negative: Secondary | ICD-10-CM | POA: Diagnosis not present

## 2015-05-28 LAB — POCT URINE PREGNANCY: Preg Test, Ur: NEGATIVE

## 2015-05-28 NOTE — Progress Notes (Signed)
Patient ID: Tammy Price, female   DOB: 1995/07/29, 20 y.o.   MRN: [id number] Tammy Price is a 20 y.o. year old 361P1001 Caucasian female who presents for placement of a Mirena IUD.  No LMP recorded. BP 110/68 mmHg  Pulse 76  Wt 174 lb (78.926 kg)  Breastfeeding? No Last sexual intercourse was prior to birth of baby, and pregnancy test today was neg  The risks and benefits of the method and placement have been thouroughly reviewed with the patient and all questions were answered.  Specifically the patient is aware of failure rate of 02/998, expulsion of the IUD and of possible perforation.  The patient is aware of irregular bleeding due to the method and understands the incidence of irregular bleeding diminishes with time.  Signed copy of informed consent in chart.   Time out was performed.  A graves speculum was placed in the vagina.  The cervix was visualized, prepped using Betadine, and grasped with a single tooth tenaculum. The uterus was found to be anteroflexed and it sounded to 7 cm.  Mirena IUD placed per manufacturer's recommendations.   The strings were trimmed to 3 cm.  Sonogram was performed and the proper placement of the IUD was verified via transvaginal u/s.   The patient was given post procedure instructions, including signs and symptoms of infection and to check for the strings after each menses or each month, and refraining from intercourse or anything in the vagina for 3 days.  She was given a Mirena care card with date IUD placed, and date IUD to be removed.  She is scheduled for a pap & IUD f/u appointment in 4 weeks.  Marge DuncansBooker, Lashica Hannay Randall CNM, [facility]NP[facility]BC 05/28/2015 3:13 PM

## 2015-05-28 NOTE — Patient Instructions (Signed)
 Nothing in vagina for 3 days (no sex, douching, tampons, etc...)  Check your strings once a month to make sure you can feel them, if you are not able to please let us know  If you develop a fever of 100.4 or more in the next few weeks, or if you develop severe abdominal pain, please let us know  Use a backup method of birth control, such as condoms, for 2 weeks   Levonorgestrel intrauterine device (IUD) What is this medicine? LEVONORGESTREL IUD (LEE voe nor jes trel) is a contraceptive (birth control) device. The device is placed inside the uterus by a healthcare professional. It is used to prevent pregnancy and can also be used to treat heavy bleeding that occurs during your period. Depending on the device, it can be used for 3 to 5 years. This medicine may be used for other purposes; ask your health care provider or pharmacist if you have questions. What should I tell my health care provider before I take this medicine? They need to know if you have any of these conditions: -abnormal Pap smear -cancer of the breast, uterus, or cervix -diabetes -endometritis -genital or pelvic infection now or in the past -have more than one sexual partner or your partner has more than one partner -heart disease -history of an ectopic or tubal pregnancy -immune system problems -IUD in place -liver disease or tumor -problems with blood clots or take blood-thinners -use intravenous drugs -uterus of unusual shape -vaginal bleeding that has not been explained -an unusual or allergic reaction to levonorgestrel, other hormones, silicone, or polyethylene, medicines, foods, dyes, or preservatives -pregnant or trying to get pregnant -breast-feeding How should I use this medicine? This device is placed inside the uterus by a health care professional. Talk to your pediatrician regarding the use of this medicine in children. Special care may be needed. Overdosage: If you think you have taken too much of  this medicine contact a poison control center or emergency room at once. NOTE: This medicine is only for you. Do not share this medicine with others. What if I miss a dose? This does not apply. What may interact with this medicine? Do not take this medicine with any of the following medications: -amprenavir -bosentan -fosamprenavir This medicine may also interact with the following medications: -aprepitant -barbiturate medicines for inducing sleep or treating seizures -bexarotene -griseofulvin -medicines to treat seizures like carbamazepine, ethotoin, felbamate, oxcarbazepine, phenytoin, topiramate -modafinil -pioglitazone -rifabutin -rifampin -rifapentine -some medicines to treat HIV infection like atazanavir, indinavir, lopinavir, nelfinavir, tipranavir, ritonavir -St. John's wort -warfarin This list may not describe all possible interactions. Give your health care provider a list of all the medicines, herbs, non-prescription drugs, or dietary supplements you use. Also tell them if you smoke, drink alcohol, or use illegal drugs. Some items may interact with your medicine. What should I watch for while using this medicine? Visit your doctor or health care professional for regular check ups. See your doctor if you or your partner has sexual contact with others, becomes HIV positive, or gets a sexual transmitted disease. This product does not protect you against HIV infection (AIDS) or other sexually transmitted diseases. You can check the placement of the IUD yourself by reaching up to the top of your vagina with clean fingers to feel the threads. Do not pull on the threads. It is a good habit to check placement after each menstrual period. Call your doctor right away if you feel more of the IUD than just   the threads or if you cannot feel the threads at all. The IUD may come out by itself. You may become pregnant if the device comes out. If you notice that the IUD has come out use a  backup birth control method like condoms and call your health care provider. Using tampons will not change the position of the IUD and are okay to use during your period. What side effects may I notice from receiving this medicine? Side effects that you should report to your doctor or health care professional as soon as possible: -allergic reactions like skin rash, itching or hives, swelling of the face, lips, or tongue -fever, flu-like symptoms -genital sores -high blood pressure -no menstrual period for 6 weeks during use -pain, swelling, warmth in the leg -pelvic pain or tenderness -severe or sudden headache -signs of pregnancy -stomach cramping -sudden shortness of breath -trouble with balance, talking, or walking -unusual vaginal bleeding, discharge -yellowing of the eyes or skin Side effects that usually do not require medical attention (report to your doctor or health care professional if they continue or are bothersome): -acne -breast pain -change in sex drive or performance -changes in weight -cramping, dizziness, or faintness while the device is being inserted -headache -irregular menstrual bleeding within first 3 to 6 months of use -nausea This list may not describe all possible side effects. Call your doctor for medical advice about side effects. You may report side effects to FDA at 1-800-FDA-1088. Where should I keep my medicine? This does not apply. NOTE: This sheet is a summary. It may not cover all possible information. If you have questions about this medicine, talk to your doctor, pharmacist, or health care provider.    2016, [employer]. (2011-03-12 13:54:04)  

## 2015-06-25 ENCOUNTER — Other Ambulatory Visit: Payer: Managed Care, Other (non HMO) | Admitting: Women's Health

## 2015-07-16 ENCOUNTER — Other Ambulatory Visit: Payer: Managed Care, Other (non HMO) | Admitting: Women's Health

## 2020-11-15 ENCOUNTER — Telehealth (INDEPENDENT_AMBULATORY_CARE_PROVIDER_SITE_OTHER): Payer: Self-pay | Admitting: Neurology

## 2020-11-15 NOTE — Telephone Encounter (Signed)
  Who's calling (name and relationship to patient) :Carmela Hurt   Best contact number:[phone]  Provider they see:Dr. NAB   Reason for call:mom called to see if she could have note sent over by fax to the school excusing her from school yesterday with her bring out with a very bad migraine. Please fax too [phone]. Mom stated that need a note for each day that is missed and not just the standard note she already gave them.      PRESCRIPTION REFILL ONLY  Name of prescription:  Pharmacy:

## 2020-11-15 NOTE — Telephone Encounter (Signed)
Yes, please send a note to school.

## 2022-12-08 LAB — CYTOLOGY - PAP

## 2023-05-18 ENCOUNTER — Ambulatory Visit (INDEPENDENT_AMBULATORY_CARE_PROVIDER_SITE_OTHER): Payer: Self-pay | Admitting: Adult Health

## 2023-05-18 ENCOUNTER — Encounter: Payer: Self-pay | Admitting: Adult Health

## 2023-05-18 VITALS — BP 139/87 | HR 111 | Ht 60.0 in | Wt 214.5 lb

## 2023-05-18 DIAGNOSIS — Z30432 Encounter for removal of intrauterine contraceptive device: Secondary | ICD-10-CM

## 2023-05-18 DIAGNOSIS — R030 Elevated blood-pressure reading, without diagnosis of hypertension: Secondary | ICD-10-CM | POA: Insufficient documentation

## 2023-05-18 NOTE — Progress Notes (Signed)
  Subjective:     Patient ID: Tammy Price, female   DOB: [birth year], 28 y.o.   MRN: [id number]  HPI Velicia is a 28 year old white female, with SO, G1P1001 in for IUD removal.   Last pap 10.2024 NILM per pt.  PCP is Dr Phillips Odor  Review of Systems For IUD removal Reviewed past medical,surgical, social and family history. Reviewed medications and allergies.     Objective:   Physical Exam BP 139/87 (BP Location: Left Arm, Patient Position: Sitting, Cuff Size: Normal)   Pulse (!) 111   Ht 5' (1.524 m)   Wt 214 lb 8 oz (97.3 kg)   BMI 41.89 kg/m  Consent signed and time out called.   Skin warm and dry.Pelvic: external genitalia is normal in appearance no lesions, vagina: pink,urethra has no lesions or masses noted, cervix: everted at os, +IUD strings seen and grasped with forceps and pt asked to cough and IUD easily removed, uterus: normal size, shape and contour, non tender, no masses felt, adnexa: no masses or tenderness noted. Bladder is non tender and no masses felt. AA is 3 Fall risk is low    05/18/2023    9:27 AM  Depression screen PHQ 2/9  Decreased Interest 0  Down, Depressed, Hopeless 0  PHQ - 2 Score 0  Altered sleeping 1  Tired, decreased energy 0  Change in appetite 0  Feeling bad or failure about yourself  0  Trouble concentrating 0  Moving slowly or fidgety/restless 0  Suicidal thoughts 0  PHQ-9 Score 1       05/18/2023    9:27 AM  GAD 7 : Generalized Anxiety Score  Nervous, Anxious, on Edge 0  Control/stop worrying 0  Worry too much - different things 0  Trouble relaxing 0  Restless 0  Easily annoyed or irritable 0  Afraid - awful might happen 0  Total GAD 7 Score 0    Upstream - 05/18/23 6295       Pregnancy Intention Screening   Does the patient want to become pregnant in the next year? No    Does the patient's partner want to become pregnant in the next year? No    Would the patient like to discuss contraceptive options today? No       Contraception Wrap Up   Current Method IUD or IUS    End Method Female Condom    Contraception Counseling Provided Yes              Examination chaperoned by Malachy Mood LPN  Assessment:     1. Encounter for IUD removal (Primary) IUD removed Take PNV with folic acid or MV and folic acid To use condoms  He is thinking of vasectomy too  2. Elevated BP without diagnosis of hypertension Keep check on BP     Plan:     Follow up prn
